# Patient Record
Sex: Female | Born: 1980 | Race: White | Hispanic: No | Marital: Married | Smoking: Never smoker
Health system: Southern US, Community
[De-identification: ages and names within clinical notes are randomized; demographics above are authoritative.]

## PROBLEM LIST (undated history)

## (undated) DIAGNOSIS — A6920 Lyme disease, unspecified: Secondary | ICD-10-CM

## (undated) DIAGNOSIS — N200 Calculus of kidney: Secondary | ICD-10-CM

## (undated) HISTORY — PX: LITHOTRIPSY: SUR834

## (undated) HISTORY — DX: Calculus of kidney: N20.0

## (undated) HISTORY — DX: Lyme disease, unspecified: A69.20

---

## 2000-08-11 ENCOUNTER — Emergency Department: Admit: 2000-08-11 | Disposition: A | Discharge status: Home / Self Care | Payer: Self-pay

## 2001-03-28 ENCOUNTER — Ambulatory Visit: Admit: 2001-03-28 | Disposition: A | Discharge status: Home / Self Care | Payer: Self-pay | Admitting: General Practice

## 2001-05-08 ENCOUNTER — Ambulatory Visit: Admit: 2001-05-08 | Disposition: A | Discharge status: Home / Self Care | Payer: Self-pay | Admitting: General Practice

## 2001-05-18 ENCOUNTER — Ambulatory Visit: Admit: 2001-05-18 | Disposition: A | Discharge status: Home / Self Care | Payer: Self-pay | Admitting: General Practice

## 2001-05-19 ENCOUNTER — Inpatient Hospital Stay: Admit: 2001-05-19 | Disposition: A | Discharge status: Home / Self Care | Payer: Self-pay | Admitting: General Practice

## 2001-10-06 ENCOUNTER — Emergency Department
Admit: 2001-10-06 | Disposition: A | Discharge status: Home / Self Care | Payer: Self-pay | Admitting: Emergency Medicine

## 2002-07-30 ENCOUNTER — Emergency Department
Admission: EM | Admit: 2002-07-30 | Disposition: A | Discharge status: Home / Self Care | Payer: Self-pay | Source: Emergency Department | Admitting: Emergency Medicine

## 2002-09-14 ENCOUNTER — Ambulatory Visit
Admission: RE | Admit: 2002-09-14 | Disposition: A | Discharge status: Home / Self Care | Payer: Self-pay | Source: Ambulatory Visit | Admitting: General Practice

## 2002-11-22 ENCOUNTER — Ambulatory Visit
Admission: AD | Admit: 2002-11-22 | Disposition: A | Discharge status: Home / Self Care | Payer: Self-pay | Source: Ambulatory Visit | Admitting: General Practice

## 2002-11-22 ENCOUNTER — Inpatient Hospital Stay
Admission: RE | Admit: 2002-11-22 | Disposition: A | Discharge status: Home / Self Care | Payer: Self-pay | Source: Ambulatory Visit | Admitting: General Practice

## 2002-12-18 ENCOUNTER — Ambulatory Visit
Admission: AD | Admit: 2002-12-18 | Disposition: A | Discharge status: Home / Self Care | Payer: Self-pay | Source: Ambulatory Visit | Admitting: General Practice

## 2003-12-15 ENCOUNTER — Emergency Department
Admission: EM | Admit: 2003-12-15 | Disposition: A | Discharge status: Home / Self Care | Payer: Self-pay | Source: Emergency Department

## 2004-10-03 ENCOUNTER — Emergency Department
Admission: EM | Admit: 2004-10-03 | Disposition: A | Discharge status: Home / Self Care | Payer: Self-pay | Source: Emergency Department | Admitting: Emergency Medicine

## 2005-02-15 ENCOUNTER — Ambulatory Visit
Admission: RE | Admit: 2005-02-15 | Disposition: A | Discharge status: Home / Self Care | Payer: Self-pay | Source: Ambulatory Visit | Admitting: Obstetrics & Gynecology

## 2005-04-02 ENCOUNTER — Observation Stay
Admission: AD | Admit: 2005-04-02 | Disposition: A | Discharge status: Home / Self Care | Payer: Self-pay | Source: Ambulatory Visit | Admitting: Obstetrics & Gynecology

## 2005-04-19 ENCOUNTER — Inpatient Hospital Stay
Admission: AD | Admit: 2005-04-19 | Disposition: A | Discharge status: Home / Self Care | Payer: Self-pay | Source: Ambulatory Visit | Admitting: Obstetrics & Gynecology

## 2006-01-20 ENCOUNTER — Emergency Department
Admission: EM | Admit: 2006-01-20 | Disposition: A | Discharge status: Home / Self Care | Payer: Self-pay | Source: Emergency Department | Admitting: Emergency Medicine

## 2006-09-09 ENCOUNTER — Emergency Department
Admission: EM | Admit: 2006-09-09 | Disposition: A | Discharge status: Home / Self Care | Payer: Self-pay | Source: Emergency Department | Admitting: Emergency Medicine

## 2006-09-12 LAB — STREP A ANTIGEN (THROAT): Streptococcus A AG: NEGATIVE

## 2006-09-12 LAB — ^INFLUENZA A & B VIRUS ANTIGEN MCKESSON: Influenza A & B Antigen: NEGATIVE

## 2008-11-12 ENCOUNTER — Emergency Department
Admission: EM | Admit: 2008-11-12 | Disposition: A | Discharge status: Home / Self Care | Payer: Self-pay | Source: Emergency Department | Admitting: Emergency Medicine

## 2008-11-13 LAB — CBC AND DIFFERENTIAL
BASOPHILS %: 0.6 % (ref 0.0–2.0)
Baso(Absolute): 0.03 10*3/uL (ref 0.00–0.20)
Eosinophils %: 1.4 % (ref 0.0–6.0)
Eosinophils Absolute: 0.07 10*3/uL — ABNORMAL LOW (ref 0.10–0.30)
Hematocrit: 41.2 % (ref 27.0–49.5)
Hemoglobin: 14.2 g/dL (ref 11.7–15.5)
Immature Granulocytes #: 0.01 10*3/uL (ref 0.00–0.05)
Immature Granulocytes %: 0.2 % — ABNORMAL HIGH (ref 0.0–0.0)
Lymphocytes Absolute: 1.35 10*3/uL (ref 1.00–4.80)
Lymphocytes Relative: 26.6 % (ref 25.0–55.0)
MCH: 31.2 pg (ref 27.0–34.0)
MCHC: 34.5 g/dL (ref 32.0–36.0)
MCV: 90.5 fL (ref 80–100)
MPV: 12 fL (ref 9.0–13.0)
Monocytes Absolute: 0.47 10*3/uL (ref 0.10–1.20)
Monocytes Relative %: 9.3 % — ABNORMAL HIGH (ref 1.0–8.0)
Neutrophils Absolute: 3.16 10*3/uL (ref 1.80–7.70)
Neutrophils Relative %: 62.1 % (ref 49.0–69.0)
Nucleated RBC %: 0 /100WBC (ref 0.0–0.0)
Nucleted RBC #: 0 10*3/uL (ref 0.00–0.00)
Platelets: 193 10*3/uL (ref 150–400)
RBC: 4.55 M/uL (ref 3.80–5.40)
RDW: 12.5 % (ref 11.0–14.0)
WBC: 5.08 10*3/uL (ref 4.80–10.80)

## 2008-11-13 LAB — B-TYPE NATRIURETIC PEPTIDE: B-Natriuretic Peptide: 8 pg/mL (ref 0–100)

## 2008-11-13 LAB — COMPREHENSIVE METABOLIC PANEL
ALT: 62 U/L — ABNORMAL HIGH (ref 7–56)
AST (SGOT): 40 U/L (ref 5–40)
Albumin, Synovial: 4.7 g/dL (ref 3.9–5.0)
Alkaline Phosphatase: 91 U/L (ref 38–126)
BUN / Creatinine Ratio: 15 (ref 8–20)
BUN: 12 mg/dL (ref 6–20)
Bilirubin, Total: 0.4 mg/dL (ref 0.2–1.3)
CO2: 27 mmol/L (ref 21.0–31.0)
Calcium: 9.4 mg/dL (ref 8.4–10.2)
Chloride: 105 mmol/L (ref 101–111)
Creatinine: 0.8 mg/dL (ref 0.52–1.04)
EGFR: >60 mL/min/{1.73_m2}
EGFR: >60 mL/min/{1.73_m2}
Glucose: 76 mg/dL (ref 70–100)
Potassium: 4.2 mmol/L (ref 3.6–5.0)
Protein, Total: 7.4 g/dL (ref 6.3–8.2)
Sodium: 139 mmol/L (ref 135–145)

## 2008-11-13 LAB — MAGNESIUM: Magnesium: 1.9 mg/dL (ref 1.7–2.2)

## 2008-11-13 LAB — PTT (PARTIAL THROMBOPLASTIN TIME)
PTT Ratio: 1 (ref 0.8–1.2)
PTT: 27.6 s (ref 21.6–34.0)

## 2008-11-13 LAB — D-DIMER, QUANTITATIVE: D-Dimer, Quant.: 324 ng/mL

## 2008-11-13 LAB — CK: Creatine Kinase (CK): 40 U/L (ref 19–204)

## 2008-11-13 LAB — TROPONIN I: Troponin I: <0.01 ng/mL (ref 0.000–0.034)

## 2008-11-13 LAB — PT (PROTHROMBIN TIME)
PT INR: 1
PT: 11.7 s (ref 10.6–12.8)

## 2008-11-13 LAB — HCG, SERUM, QUALITATIVE: Hcg Qualitative: NEGATIVE

## 2008-11-13 LAB — MYOGLOBIN, SERUM: Myoglobin: 21 ng/mL (ref 0–62)

## 2008-12-15 ENCOUNTER — Emergency Department
Admission: EM | Admit: 2008-12-15 | Disposition: A | Discharge status: Home / Self Care | Payer: Self-pay | Source: Emergency Department | Admitting: Emergency Medicine

## 2008-12-17 LAB — COMPREHENSIVE METABOLIC PANEL
ALT: 60 U/L — ABNORMAL HIGH (ref 7–56)
AST (SGOT): 39 U/L (ref 5–40)
Albumin, Synovial: 4.6 g/dL (ref 3.9–5.0)
Alkaline Phosphatase: 95 U/L (ref 38–126)
BUN / Creatinine Ratio: 21 — ABNORMAL HIGH (ref 8–20)
BUN: 17 mg/dL (ref 6–20)
Bilirubin, Total: 0.3 mg/dL (ref 0.2–1.3)
CO2: 27 mmol/L (ref 21.0–31.0)
Calcium: 9 mg/dL (ref 8.4–10.2)
Chloride: 102 mmol/L (ref 101–111)
Creatinine: 0.82 mg/dL (ref 0.52–1.04)
EGFR: >60 mL/min/{1.73_m2}
EGFR: >60 mL/min/{1.73_m2}
Glucose: 85 mg/dL (ref 70–100)
Potassium: 3.6 mmol/L (ref 3.6–5.0)
Protein, Total: 7 g/dL (ref 6.3–8.2)
Sodium: 140 mmol/L (ref 135–145)

## 2008-12-17 LAB — CBC AND DIFFERENTIAL
BASOPHILS %: 0.7 % (ref 0.0–2.0)
Baso(Absolute): 0.05 10*3/uL (ref 0.00–0.20)
Eosinophils %: 1 % (ref 0.0–6.0)
Eosinophils Absolute: 0.07 10*3/uL — ABNORMAL LOW (ref 0.10–0.30)
Hematocrit: 40.2 % (ref 27.0–49.5)
Hemoglobin: 14.1 g/dL (ref 11.7–15.5)
Immature Granulocytes #: 0.02 10*3/uL (ref 0.00–0.05)
Immature Granulocytes %: 0.3 % — ABNORMAL HIGH (ref 0.0–0.0)
Lymphocytes Absolute: 2.35 10*3/uL (ref 1.00–4.80)
Lymphocytes Relative: 32.5 % (ref 25.0–55.0)
MCH: 31.3 pg (ref 27.0–34.0)
MCHC: 35.1 g/dL (ref 32.0–36.0)
MCV: 89.3 fL (ref 80–100)
MPV: 11.5 fL (ref 9.0–13.0)
Monocytes Absolute: 0.56 10*3/uL (ref 0.10–1.20)
Monocytes Relative %: 7.8 % (ref 1.0–8.0)
Neutrophils Absolute: 4.19 10*3/uL (ref 1.80–7.70)
Neutrophils Relative %: 58 % (ref 49.0–69.0)
Nucleated RBC %: 0 /100WBC (ref 0.0–0.0)
Nucleted RBC #: 0 10*3/uL (ref 0.00–0.00)
Platelets: 200 10*3/uL (ref 150–400)
RBC: 4.5 M/uL (ref 3.80–5.40)
RDW: 12.4 % (ref 11.0–14.0)
WBC: 7.22 10*3/uL (ref 4.80–10.80)

## 2008-12-17 LAB — HCG, SERUM, QUALITATIVE: Hcg Qualitative: NEGATIVE

## 2008-12-17 LAB — D-DIMER, QUANTITATIVE: D-Dimer, Quant.: 263 ng/mL

## 2008-12-17 LAB — TROPONIN I: Troponin I: <0.01 ng/mL (ref 0.000–0.034)

## 2008-12-17 LAB — SEDIMENTATION RATE, AUTOMATED: Sed Rate: 20 (ref 0–20)

## 2008-12-17 LAB — CK: Creatine Kinase (CK): 36 U/L (ref 19–204)

## 2009-08-03 ENCOUNTER — Emergency Department
Admission: EM | Admit: 2009-08-03 | Disposition: A | Discharge status: Home / Self Care | Payer: Self-pay | Source: Emergency Department | Admitting: Emergency Medicine

## 2009-08-05 LAB — COMPREHENSIVE METABOLIC PANEL
ALT: 40 U/L (ref 7–56)
AST (SGOT): 38 U/L (ref 5–40)
Albumin, Synovial: 4.6 g/dL (ref 3.9–5.0)
Alkaline Phosphatase: 78 U/L (ref 38–126)
BUN / Creatinine Ratio: 22 — ABNORMAL HIGH (ref 8–20)
BUN: 18 mg/dL (ref 6–20)
Bilirubin, Total: 0.3 mg/dL (ref 0.2–1.3)
CO2: 26 mmol/L (ref 21.0–31.0)
Calcium: 9.7 mg/dL (ref 8.4–10.2)
Chloride: 103 mmol/L (ref 101–111)
Creatinine: 0.8 mg/dL (ref 0.52–1.04)
EGFR: >60 mL/min/{1.73_m2}
EGFR: >60 mL/min/{1.73_m2}
Glucose: 87 mg/dL (ref 70–100)
Potassium: 4 mmol/L (ref 3.6–5.0)
Protein, Total: 6.8 g/dL (ref 6.3–8.2)
Sodium: 137 mmol/L (ref 135–145)

## 2009-08-05 LAB — CBC AND DIFFERENTIAL
BASOPHILS %: 0.5 % (ref 0.0–2.0)
Baso(Absolute): 0.04 10*3/uL (ref 0.00–0.20)
Eosinophils %: 5.6 % (ref 0.0–6.0)
Eosinophils Absolute: 0.42 10*3/uL — ABNORMAL HIGH (ref 0.10–0.30)
Hematocrit: 37.7 % (ref 27.0–49.5)
Hemoglobin: 13.1 g/dL (ref 11.7–15.5)
Immature Granulocytes #: 0.01 10*3/uL (ref 0.00–0.05)
Immature Granulocytes %: 0.1 % — ABNORMAL HIGH (ref 0.0–0.0)
Lymphocytes Absolute: 1.87 10*3/uL (ref 1.00–4.80)
Lymphocytes Relative: 25 % (ref 25.0–55.0)
MCH: 31.4 pg (ref 27.0–34.0)
MCHC: 34.7 g/dL (ref 32.0–36.0)
MCV: 90.4 fL (ref 80–100)
MPV: 11.5 fL (ref 9.0–13.0)
Monocytes Absolute: 0.52 10*3/uL (ref 0.10–1.20)
Monocytes Relative %: 7 % (ref 1.0–8.0)
Neutrophils Absolute: 4.62 10*3/uL (ref 1.80–7.70)
Neutrophils Relative %: 61.9 % (ref 49.0–69.0)
Nucleated RBC %: 0 /100WBC (ref 0.0–0.0)
Nucleted RBC #: 0 10*3/uL (ref 0.00–0.00)
Platelets: 193 10*3/uL (ref 150–400)
RBC: 4.17 M/uL (ref 3.80–5.40)
RDW: 12.4 % (ref 11.0–14.0)
WBC: 7.47 10*3/uL (ref 4.80–10.80)

## 2009-08-05 LAB — URINALYSIS
Bilirubin, UA: NEGATIVE
Blood, UA: NEGATIVE
Glucose, UA: NEGATIVE
Ketones UA: NEGATIVE
Leukocyte Esterase, UA: NEGATIVE
Nitrate: NEGATIVE
Protein, UR: NEGATIVE
Specific Gravity, UR: 1.01 (ref 1.000–1.035)
Urobilinogen, UA: NORMAL
pH, Urine: 7 (ref 5.0–8.0)

## 2009-08-05 LAB — LIPASE: Lipase: 94 U/L (ref 23–300)

## 2009-08-05 LAB — HCG, QUALITATIVE URINE: Urine HCG Qualitative: NEGATIVE

## 2009-08-07 ENCOUNTER — Emergency Department
Admission: EM | Admit: 2009-08-07 | Disposition: A | Discharge status: Home / Self Care | Payer: Self-pay | Source: Emergency Department | Admitting: Emergency Medicine

## 2009-08-08 LAB — CBC AND DIFFERENTIAL
BASOPHILS %: 0.5 % (ref 0.0–2.0)
Baso(Absolute): 0.03 10*3/uL (ref 0.00–0.20)
Eosinophils %: 5.4 % (ref 0.0–6.0)
Eosinophils Absolute: 0.3 10*3/uL (ref 0.10–0.30)
Hematocrit: 39.2 % (ref 27.0–49.5)
Hemoglobin: 13.7 g/dL (ref 11.7–15.5)
Immature Granulocytes #: 0.01 10*3/uL (ref 0.00–0.05)
Immature Granulocytes %: 0.2 % — ABNORMAL HIGH (ref 0.0–0.0)
Lymphocytes Absolute: 1.13 10*3/uL (ref 1.00–4.80)
Lymphocytes Relative: 20.2 % — ABNORMAL LOW (ref 25.0–55.0)
MCH: 31.4 pg (ref 27.0–34.0)
MCHC: 34.9 g/dL (ref 32.0–36.0)
MCV: 89.9 fL (ref 80–100)
MPV: 11.7 fL (ref 9.0–13.0)
Monocytes Absolute: 0.42 10*3/uL (ref 0.10–1.20)
Monocytes Relative %: 7.5 % (ref 1.0–8.0)
Neutrophils Absolute: 3.71 10*3/uL (ref 1.80–7.70)
Neutrophils Relative %: 66.4 % (ref 49.0–69.0)
Nucleated RBC %: 0 /100WBC (ref 0.0–0.0)
Nucleted RBC #: 0 10*3/uL (ref 0.00–0.00)
Platelets: 184 10*3/uL (ref 150–400)
RBC: 4.36 M/uL (ref 3.80–5.40)
RDW: 12.2 % (ref 11.0–14.0)
WBC: 5.59 10*3/uL (ref 4.80–10.80)

## 2009-08-08 LAB — COMPREHENSIVE METABOLIC PANEL
ALT: 48 U/L (ref 7–56)
AST (SGOT): 33 U/L (ref 5–40)
Albumin, Synovial: 4.4 g/dL (ref 3.9–5.0)
Alkaline Phosphatase: 75 U/L (ref 38–126)
BUN / Creatinine Ratio: 14 (ref 8–20)
BUN: 12 mg/dL (ref 6–20)
Bilirubin, Total: 0.3 mg/dL (ref 0.2–1.3)
CO2: 27 mmol/L (ref 21.0–31.0)
Calcium: 9.8 mg/dL (ref 8.4–10.2)
Chloride: 103 mmol/L (ref 101–111)
Creatinine: 0.81 mg/dL (ref 0.52–1.04)
EGFR: >60 mL/min/{1.73_m2}
EGFR: >60 mL/min/{1.73_m2}
Glucose: 102 mg/dL — ABNORMAL HIGH (ref 70–100)
Potassium: 4.2 mmol/L (ref 3.6–5.0)
Protein, Total: 6.6 g/dL (ref 6.3–8.2)
Sodium: 141 mmol/L (ref 135–145)

## 2009-08-08 LAB — LIPASE: Lipase: 53 U/L (ref 23–300)

## 2010-02-25 ENCOUNTER — Emergency Department
Admission: EM | Admit: 2010-02-25 | Disposition: A | Discharge status: Home / Self Care | Payer: Self-pay | Source: Emergency Department | Admitting: Emergency Medicine

## 2010-02-26 LAB — URINALYSIS
Bilirubin, UA: NEGATIVE
Blood, UA: NEGATIVE
Glucose, UA: NEGATIVE
Leukocyte Esterase, UA: NEGATIVE
Nitrate: NEGATIVE
Protein, UR: NEGATIVE
Specific Gravity, UR: 1.019 (ref 1.000–1.035)
Urobilinogen, UA: NORMAL
pH, Urine: 6 (ref 5.0–8.0)

## 2010-02-26 LAB — HCG, QUALITATIVE URINE: Urine HCG Qualitative: NEGATIVE

## 2010-05-12 ENCOUNTER — Emergency Department
Admission: EM | Admit: 2010-05-12 | Disposition: A | Discharge status: Home / Self Care | Payer: Self-pay | Source: Emergency Department | Admitting: Emergency Medicine

## 2010-05-28 ENCOUNTER — Emergency Department
Admission: EM | Admit: 2010-05-28 | Disposition: A | Discharge status: Home / Self Care | Payer: Self-pay | Source: Emergency Department | Admitting: Emergency Medicine

## 2010-10-31 ENCOUNTER — Emergency Department
Admission: EM | Admit: 2010-10-31 | Disposition: A | Discharge status: Home / Self Care | Payer: Self-pay | Source: Emergency Department | Admitting: Emergency Medicine

## 2012-04-30 LAB — ECG 12-LEAD
Atrial Rate: 79 {beats}/min
Atrial Rate: 82 {beats}/min
P Axis: 22 degrees
P Axis: 52 degrees
P-R Interval: 122 ms
P-R Interval: 124 ms
Q-T Interval: 374 ms
Q-T Interval: 378 ms
QRS Duration: 80 ms
QRS Duration: 80 ms
QTC Calculation (Bezet): 428 ms
QTC Calculation (Bezet): 441 ms
R Axis: 87 degrees
R Axis: 84 degrees
T Axis: 53 degrees
T Axis: 60 degrees
Ventricular Rate: 79 {beats}/min
Ventricular Rate: 82 {beats}/min

## 2013-11-21 ENCOUNTER — Emergency Department
Admission: EM | Admit: 2013-11-21 | Discharge: 2013-11-21 | Disposition: A | Discharge status: Home / Self Care | Payer: Medicaid Other | Attending: Emergency Medicine | Admitting: Emergency Medicine

## 2013-11-21 ENCOUNTER — Emergency Department: Payer: Medicaid Other

## 2013-11-21 DIAGNOSIS — Z87442 Personal history of urinary calculi: Secondary | ICD-10-CM | POA: Insufficient documentation

## 2013-11-21 DIAGNOSIS — R109 Unspecified abdominal pain: Secondary | ICD-10-CM

## 2013-11-21 DIAGNOSIS — N12 Tubulo-interstitial nephritis, not specified as acute or chronic: Secondary | ICD-10-CM | POA: Insufficient documentation

## 2013-11-21 LAB — COMPREHENSIVE METABOLIC PANEL
ALT: 25 U/L (ref 0–55)
AST (SGOT): 21 U/L (ref 5–34)
Albumin/Globulin Ratio: 1.7 (ref 0.9–2.2)
Albumin: 3.8 g/dL (ref 3.5–5.0)
Alkaline Phosphatase: 64 U/L (ref 40–150)
Anion Gap: 9 (ref 5.0–15.0)
BUN: 12 mg/dL (ref 7.0–19.0)
Bilirubin, Total: 0.2 mg/dL (ref 0.2–1.2)
CO2: 24 mEq/L (ref 22–29)
Calcium: 9.2 mg/dL (ref 8.5–10.5)
Chloride: 108 mEq/L — ABNORMAL HIGH (ref 98–107)
Creatinine: 0.8 mg/dL (ref 0.6–1.0)
Globulin: 2.3 g/dL (ref 2.0–3.6)
Glucose: 102 mg/dL — ABNORMAL HIGH (ref 70–100)
Potassium: 3.7 mEq/L (ref 3.5–5.1)
Protein, Total: 6.1 g/dL (ref 6.0–8.3)
Sodium: 141 mEq/L (ref 136–145)

## 2013-11-21 LAB — URINALYSIS
Bilirubin, UA: NEGATIVE
Blood, UA: NEGATIVE
Glucose, UA: NEGATIVE
Ketones UA: NEGATIVE
Nitrite, UA: NEGATIVE
Protein, UR: NEGATIVE
Specific Gravity UA: 1.027 (ref 1.001–1.035)
Urine pH: 6 (ref 5.0–8.0)
Urobilinogen, UA: 0.2 mg/dL (ref 0.2–2.0)

## 2013-11-21 LAB — CBC AND DIFFERENTIAL
Basophils Absolute Automated: 0.04 10*3/uL (ref 0.00–0.20)
Basophils Automated: 0 %
Eosinophils Absolute Automated: 0.09 10*3/uL (ref 0.00–0.70)
Eosinophils Automated: 1 %
Hematocrit: 35.8 % — ABNORMAL LOW (ref 37.0–47.0)
Hgb: 11.9 g/dL — ABNORMAL LOW (ref 12.0–16.0)
Lymphocytes Absolute Automated: 2.44 10*3/uL (ref 0.50–4.40)
Lymphocytes Automated: 33 %
MCH: 31.1 pg (ref 28.0–32.0)
MCHC: 33.2 g/dL (ref 32.0–36.0)
MCV: 93.5 fL (ref 80.0–100.0)
MPV: 11.1 fL (ref 9.4–12.3)
Monocytes Absolute Automated: 0.72 10*3/uL (ref 0.00–1.20)
Monocytes: 10 %
Neutrophils Absolute: 4.17 10*3/uL (ref 1.80–8.10)
Neutrophils: 56 %
Platelets: 214 10*3/uL (ref 140–400)
RBC: 3.83 10*6/uL — ABNORMAL LOW (ref 4.20–5.40)
RDW: 12 % (ref 12–15)
WBC: 7.46 10*3/uL (ref 3.50–10.80)

## 2013-11-21 LAB — URINE HCG QUALITATIVE: Urine HCG Qualitative: NEGATIVE

## 2013-11-21 LAB — URINE MICROSCOPIC

## 2013-11-21 LAB — GFR: EGFR: >60

## 2013-11-21 MED ORDER — HYDROMORPHONE HCL PF 1 MG/ML IJ SOLN
0.5000 mg | Freq: Once | INTRAMUSCULAR | Status: AC
Start: 2013-11-21 — End: 2013-11-21
  Administered 2013-11-21: 0.5 mg via INTRAVENOUS
  Filled 2013-11-21: qty 1

## 2013-11-21 MED ORDER — OXYCODONE-ACETAMINOPHEN 5-325 MG PO TABS
ORAL_TABLET | ORAL | Status: AC
Start: 2013-11-21 — End: 2013-12-01

## 2013-11-21 MED ORDER — OXYCODONE-ACETAMINOPHEN 5-325 MG PO TABS
1.0000 | ORAL_TABLET | Freq: Once | ORAL | Status: DC
Start: 2013-11-21 — End: 2013-11-21

## 2013-11-21 MED ORDER — OXYCODONE-ACETAMINOPHEN 5-325 MG PO TABS
2.0000 | ORAL_TABLET | Freq: Once | ORAL | Status: AC
Start: 2013-11-21 — End: 2013-11-21
  Administered 2013-11-21: 2 via ORAL
  Filled 2013-11-21: qty 2

## 2013-11-21 MED ORDER — ONDANSETRON HCL 4 MG/2ML IJ SOLN
4.0000 mg | Freq: Once | INTRAMUSCULAR | Status: AC
Start: 2013-11-21 — End: 2013-11-21
  Administered 2013-11-21: 4 mg via INTRAVENOUS
  Filled 2013-11-21: qty 2

## 2013-11-21 MED ORDER — KETOROLAC TROMETHAMINE 30 MG/ML IJ SOLN
30.0000 mg | Freq: Once | INTRAMUSCULAR | Status: AC
Start: 2013-11-21 — End: 2013-11-21
  Administered 2013-11-21: 30 mg via INTRAVENOUS
  Filled 2013-11-21: qty 1

## 2013-11-21 MED ORDER — CIPROFLOXACIN HCL 500 MG PO TABS
500.0000 mg | ORAL_TABLET | Freq: Once | ORAL | Status: AC
Start: 2013-11-21 — End: 2013-11-21
  Administered 2013-11-21: 500 mg via ORAL
  Filled 2013-11-21: qty 1

## 2013-11-21 MED ORDER — CIPROFLOXACIN HCL 500 MG PO TABS
500.0000 mg | ORAL_TABLET | Freq: Two times a day (BID) | ORAL | Status: AC
Start: 2013-11-21 — End: 2013-11-28

## 2013-11-21 NOTE — ED Provider Notes (Signed)
Physician/Midlevel provider first contact with patient: 11/21/13 0042         History     Chief Complaint   Patient presents with   . Flank Pain     Patient is a 33 y.o. female presenting with flank pain. The history is provided by the patient and the spouse.   Flank Pain  This is a recurrent problem. The current episode started more than 1 month ago. The problem occurs intermittently. The problem has been gradually worsening. Associated symptoms include urinary symptoms. Pertinent negatives include no abdominal pain, change in bowel habit, chest pain, chills, congestion, coughing, fever, headaches, nausea, neck pain, numbness, vomiting or weakness. Nothing aggravates the symptoms. Treatments tried: essential oils  The treatment provided mild relief.   33 year female with right sided flank pain off and on for 2 months, has pain every few days and lasts short time. Worse few days ago right flank sharp stabby pain 6/10, nothing makes worse or better. Took essential oils for pain. Nausea but no vomiting. Blood noted in urine today and felt was urine related. Stool changes noted thinner, LBM today. Feels like this may be another kidney stone    PCP Edwyna Shell   Past Medical History   Diagnosis Date   . Calculus of kidney    . Lyme disease    LMP 2 weeks ago denies pregnancy     Past Surgical History   Procedure Date   . Lithotripsy        No family history on file.    Social  History   Substance Use Topics   . Smoking status: Never Smoker    . Smokeless tobacco: Not on file   . Alcohol Use: No   .     No Known Allergies    Current/Home Medications    No medications on file        Review of Systems   Constitutional: Negative for fever and chills.   HENT: Negative for congestion and rhinorrhea.    Respiratory: Negative for cough and shortness of breath.    Cardiovascular: Negative for chest pain.   Gastrointestinal: Negative for nausea, vomiting, abdominal pain and change in bowel habit.   Genitourinary: Positive for hematuria  and flank pain. Negative for dysuria, vaginal bleeding and vaginal discharge.   Musculoskeletal: Positive for back pain. Negative for gait problem and neck pain.   Neurological: Negative for weakness, numbness and headaches.   All other systems reviewed and are negative.        Physical Exam    BP: 123/74 mmHg, Heart Rate: 99 , Temp: 96.6 F (35.9 C), Resp Rate: 20 , SpO2: 100 %, Weight: 72.576 kg    Physical Exam   Nursing note and vitals reviewed.  Constitutional: She is oriented to person, place, and time. She appears well-developed and well-nourished. No distress.   HENT:   Head: Normocephalic and atraumatic.   Nose: Nose normal.   Mouth/Throat: Oropharynx is clear and moist. No oropharyngeal exudate.   Eyes: Conjunctivae normal and EOM are normal. Pupils are equal, round, and reactive to light.   Neck: Normal range of motion. Neck supple.   Cardiovascular: Normal rate, normal heart sounds and intact distal pulses.    No murmur heard.  Pulmonary/Chest: Effort normal and breath sounds normal. No respiratory distress. She has no wheezes. She has no rales.   Abdominal: Soft. Bowel sounds are normal. She exhibits no distension. There is no tenderness. There is no CVA tenderness.  Musculoskeletal: Normal range of motion. She exhibits no edema and no tenderness.        Lumbar back: Normal.   Lymphadenopathy:     She has no cervical adenopathy.   Neurological: She is alert and oriented to person, place, and time. No cranial nerve deficit or sensory deficit. She exhibits normal muscle tone. Coordination and gait normal.   Skin: Skin is warm and dry. No rash noted. She is not diaphoretic. No erythema. No pallor.   Psychiatric: She has a normal mood and affect. Her behavior is normal.       MDM and ED Course     ED Medication Orders      Start     Status Ordering Provider    11/21/13 0247      Once,   Status:  Discontinued      Route: Oral  Ordered Dose: 1 tablet         Discontinued Marlane Hatcher J    11/21/13 0220    ciprofloxacin (CIPRO) tablet 500 mg   Once in ED      Route: Oral  Ordered Dose: 500 mg         Last MAR action:  Given Reginia Forts    11/21/13 0220   oxyCODONE-acetaminophen (PERCOCET) 5-325 MG per tablet 2 tablet   Once      Route: Oral  Ordered Dose: 2 tablet         Last MAR action:  Given Reginia Forts    11/21/13 1610   ketorolac (TORADOL) injection 30 mg   Once      Route: Intravenous  Ordered Dose: 30 mg         Last MAR action:  Given Reginia Forts    11/21/13 0104   HYDROmorphone (DILAUDID) injection 0.5 mg   Once      Route: Intravenous  Ordered Dose: 0.5 mg         Last MAR action:  Given Neave Lenger J    11/21/13 0104   ondansetron (ZOFRAN) injection 4 mg   Once      Route: Intravenous  Ordered Dose: 4 mg         Last MAR action:  Given Syla Devoss J                 MDM  Number of Diagnoses or Management Options  Pyelonephritis:   Right flank pain:   Diagnosis management comments: Dr. Marlane Hatcher  is the primary attending for this patient and has obtained and performed the history, PE, and medical decision making for this patient.    Oxygen saturation by pulse oximetry is 95%-100%, Normal.  Interventions: None Needed and Patient Observed.     Results     Procedure Component Value Units Date/Time    Comprehensive Metabolic Panel (CMP) ([id number])  (Abnormal)   Collected:11/21/13 0128    Specimen Information:Blood Updated:11/21/13 0154     Glucose 102 (H) mg/dL      BUN 81.1 mg/dL      Creatinine 0.8 mg/dL      Sodium 914 mEq/L      Potassium 3.7 mEq/L      Chloride 108 (H) mEq/L      CO2 24 mEq/L      CALCIUM 9.2 mg/dL      Protein, Total 6.1 g/dL      Albumin 3.8 g/dL      AST (SGOT) 21 U/L      ALT 25  U/L      Alkaline Phosphatase 64 U/L      Bilirubin, Total 0.2 mg/dL      Globulin 2.3 g/dL      Albumin/Globulin Ratio 1.7      Anion Gap 9.0     GFR ([id number]) Collected:11/21/13 0128     EGFR >60.0 Updated:11/21/13 0154    CBC with Differential ([id number])  (Abnormal) Collected:11/21/13  0128    Specimen Information:Blood / Blood Updated:11/21/13 0133     WBC 7.46 x10 3/uL      RBC 3.83 (L) x10 6/uL      Hgb 11.9 (L) g/dL      Hematocrit 47.8 (L) %      MCV 93.5 fL      MCH 31.1 pg      MCHC 33.2 g/dL      RDW 12 %      Platelets 214 x10 3/uL      MPV 11.1 fL      Neutrophils 56 %      Lymphocytes Automated 33 %      Monocytes 10 %      Eosinophils Automated 1 %      Basophils Automated 0 %      Immature Granulocyte Unmeasured %      Nucleated RBC Unmeasured /100 WBC      Neutrophils Absolute 4.17 x10 3/uL      Abs Lymph Automated 2.44 x10 3/uL      Abs Mono Automated 0.72 x10 3/uL      Abs Eos Automated 0.09 x10 3/uL      Absolute Baso Automated 0.04 x10 3/uL      Absolute Immature Granulocyte Unmeasured x10 3/uL     Urinalysis ([id number])  (Abnormal) Collected:11/21/13 0049     Urine Type Clean Catch Updated:11/21/13 0113     Color, UA YELLOW      Clarity, UA SL CLOUDY (A)      Specific Gravity UA 1.027      Urine pH 6.0      Leukocyte Esterase, UA TRACE (A)      Nitrite, UA NEGATIVE      Protein, UR NEGATIVE      Glucose, UA NEGATIVE      Ketones UA NEGATIVE      Urobilinogen, UA 0.2 mg/dL      Bilirubin, UA NEGATIVE      Blood, UA NEGATIVE     Microscopic, Urine ([id number])  (Abnormal) Collected:11/21/13 0049     RBC, UA 0 - 5 /hpf Updated:11/21/13 0113     WBC, UA 6 - 10 (A) /hpf      Squamous Epithelial Cells, Urine 0 - 5 /hpf      Urine Bacteria Few (A) /hpf     HCG, Qualitative, Urine ([id number]) Collected:11/21/13 0049     Urine HCG Qualitative Negative Updated:11/21/13 0103        Radiology Results (24 Hour)     Procedure Component Value Units Date/Time    CT Abd/ Pelvis without Contrast ([id number]) Collected:11/21/13 0130    Order Status:Completed  Updated:11/21/13 5366    Narrative:    Reason for exam: 33 year old female with right flank pain. Evaluate for  kidney stones.    TECHNIQUE: Spiral CT without contrast.    FINDINGS:  There are no detectable renal calculi. The kidneys are normal  in size  and shape. There is no hydronephrosis. No stones can be seen within  either ureter or within the bladder lumen. There is a  right pelvic  phlebolith.    Elsewhere, limited noncontrast evaluation of the liver, spleen,  pancreas, and adrenal glands is unremarkable. Gallbladder is contracted.  There is no bowel thickening, distention, or perceived inflammation.  There is no obstruction or free air. The appendix is normal. There is no  lymphadenopathy or ascites. Pelvic organs are intact.    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 Lung bases are clear.     Impression:     1. No detectable renal calculi.  2. No detectable acute abnormalities.    Wilmon Pali, MD   11/21/2013 1:32 AM    Discussed with patient about ct and labs, no renal calc on ct or blood noted in urine, pain better with meds  Discussed with her about plan of care.        Amount and/or Complexity of Data Reviewed  Clinical lab tests: ordered and reviewed  Tests in the radiology section of CPT: ordered and reviewed    Patient Progress  Patient progress: improved        Procedures    Clinical Impression & Disposition     Clinical Impression  Final diagnoses:   Right flank pain   Pyelonephritis        ED Disposition     Discharge Anakaren Campion discharge to home/self care.    Condition at disposition: Stable             New Prescriptions    CIPROFLOXACIN (CIPRO) 500 MG TABLET    Take 1 tablet (500 mg total) by mouth 2 (two) times daily.    OXYCODONE-ACETAMINOPHEN (PERCOCET) 5-325 MG PER TABLET    1-2 tablets by mouth every 4-6 hours as needed for pain;  Do not drive or operate machinery while taking this medicine                 Sharada Albornoz, Lindalou Hose, MD  11/21/13 [id number]

## 2013-11-21 NOTE — ED Notes (Signed)
pt w/ hx of kidney stones, c/o pain to right flank that radiates around the front, sharp stabbing, pt sts the sharp pains have been coming and going for a couple of months tonight increases in severity and time. Pt sts  Having some blood in her urine as well.

## 2013-11-21 NOTE — Discharge Instructions (Signed)
Pyelonephritis     You have been diagnosed with pyelonephritis. This is also known as a kidney infection.     Pyelonephritis is a bacterial infection in the kidneys. Some symptoms are: Fever (temperature higher than 100.4ºF / 38ºC), chills, nausea (sick to the stomach) and vomiting (throwing up). Back pain on one or both sides is another symptom. There is sometimes trouble urinating (peeing). You may also feel very weak. The diagnosis is made based on your symptoms and a test of your urine.     Pyelonephritis most happens most often when a lower urinary tract infection (bladder infection) climbs up the urinary tract and gets into the kidney. It is important to treat a bladder infection early. This is to prevent pyelonephritis and possible kidney damage.     Pyelonephritis is treated with antibiotics, pain and fever medicines and fluids. Some patients need an "IV" if they have nausea (sick to the stomach) or vomiting and cannot keep down the antibiotics, or if they aren’t getting better after 2-3 days of oral (by mouth) medications. Most pyelonephritis patients do not need to check into the hospital. A few patients who don’t do well with the oral (by mouth) antibiotics or get sicker despite treatment may need to get admitted to the hospital.     YOU SHOULD SEEK MEDICAL ATTENTION IMMEDIATELY, EITHER HERE OR AT THE NEAREST EMERGENCY DEPARTMENT, IF ANY OF THE FOLLOWING OCCURS:  · Failure to improve after 2-3 days of antibiotics.  · Nausea or vomiting develops and you cannot to keep down medicines or fluids.  · Increased weakness or lightheadedness.  · Any progressive or worsening symptoms or any other concerns develop.

## 2013-11-25 ENCOUNTER — Other Ambulatory Visit (INDEPENDENT_AMBULATORY_CARE_PROVIDER_SITE_OTHER): Payer: Self-pay | Admitting: Emergency Medicine

## 2014-06-16 ENCOUNTER — Emergency Department: Payer: Self-pay

## 2014-06-16 ENCOUNTER — Emergency Department
Admission: EM | Admit: 2014-06-16 | Discharge: 2014-06-16 | Disposition: A | Discharge status: Home / Self Care | Payer: Self-pay | Attending: Emergency Medicine | Admitting: Emergency Medicine

## 2014-06-16 DIAGNOSIS — S93401A Sprain of unspecified ligament of right ankle, initial encounter: Secondary | ICD-10-CM | POA: Insufficient documentation

## 2014-06-16 DIAGNOSIS — Y9301 Activity, walking, marching and hiking: Secondary | ICD-10-CM | POA: Insufficient documentation

## 2014-06-16 DIAGNOSIS — X58XXXA Exposure to other specified factors, initial encounter: Secondary | ICD-10-CM | POA: Insufficient documentation

## 2014-06-16 MED ORDER — OXYCODONE-ACETAMINOPHEN 5-325 MG PO TABS
ORAL_TABLET | ORAL | Status: DC
Start: 2014-06-16 — End: 2014-07-23

## 2014-06-16 MED ORDER — IBUPROFEN 800 MG PO TABS
800.0000 mg | ORAL_TABLET | Freq: Three times a day (TID) | ORAL | Status: DC | PRN
Start: 2014-06-16 — End: 2014-07-23

## 2014-06-16 MED ORDER — OXYCODONE-ACETAMINOPHEN 5-325 MG PO TABS
1.0000 | ORAL_TABLET | Freq: Once | ORAL | Status: AC
Start: 2014-06-16 — End: 2014-06-16
  Administered 2014-06-16: 1 via ORAL
  Filled 2014-06-16: qty 1

## 2014-06-16 NOTE — Discharge Instructions (Signed)
Ankle Sprain     You have been diagnosed with an ankle sprain.     A sprain is a ligament injury, usually a tear or partial tear. Sprains can hurt as much as broken bones. Sprains can be classified by the degree of injury. A first-degree sprain is considered a minor tear. A second-degree sprain is a partial tear of the ligament. A third-degree sprain often involves a small fracture, or break, of the bone that the ligament is attached to.     Sprains are usually treated with pain medication and a splint to keep the joint from moving. You should Rest, Ice, Compress, and Elevate the injured ankle. Remember this as "RICE."  · REST: Limit the use of the injured body part.  · ICE: By applying ice to the affected area, swelling and pain can be reduced. Place some ice cubes in a re-sealable (Ziploc®) bag and add some water. Put a thin washcloth between the bag and the skin. Apply the ice bag to the area for at least 20 minutes. Do this at least 4 times per day. Using the ice for longer times and more frequently is OK. NEVER APPLY ICE DIRECTLY TO THE SKIN.  · COMPRESS: Compression means to apply pressure around the injured area such as with a splint, cast or an ACE® bandage. Compression decreases swelling and improves comfort. Compression should be tight enough to relieve swelling but not so tight as to decrease circulation. Increasing pain, numbness, tingling, or change in skin color, are all signs of decreased circulation.  · ELEVATE: Elevate the injured part. For example, a sprained ankle can be placed up on a chair while sitting and propped up on pillows while lying down.     You have been given an ACE® BANDAGE. The bandage will compress the ankle. This increases comfort and reduces swelling. The ACE® bandage should fit snugly but not so tight as to decrease circulation (blood supply). Watch for swelling of the area outside the ACE® wrap. Check capillary refill (circulation) in your toenails. To do this, press on the  nail. It should turn white. When you let go, the nail should return to pink in less than 2 seconds. If it doesn’t, the bandage is too tight. Loosen the wrap if you need to.     Wear the ACE® bandage:  · Under your air/gel splint as long as you are using the splint.     You have been given an AIR/GEL SPLINT to use. For the next 2 weeks, wear the splint all the time except when you sleep or take a bath. After 2 weeks, keep using the splint when you play sports, run, hike, walk on uneven ground, or do any activity that might injure your ankle.     Ankle exercises are described below. Begin the exercises as soon as you are able. They will make the ankle stronger to prevent new injuries. Do the exercises 5 to 10 times each day.  · Use your big toe to draw out the letters of the alphabet on the ground. Move your ankle as you make each letter.  · Sit with your leg straight out in front of you. Wrap a towel around the ball of your foot (just below your toes) and pull back. Pull hard enough to stretch the ankle. Don’t pull hard enough to cause pain. Hold the stretch for 30 seconds.  · Stand up. Rock onto the tiptoes of the injured foot and then return to the flat position. Repeat 10 times.  · Rotate   your ankle in a circle. Make 10 clockwise circles, then make 10 circles going the other way.     YOU SHOULD SEEK MEDICAL ATTENTION IMMEDIATELY, EITHER HERE OR AT THE NEAREST EMERGENCY DEPARTMENT, IF ANY OF THE FOLLOWING OCCURS:  · Your pain gets much worse.  · Your ankle or foot starts to tingle or it becomes numb.  · Your foot is cold or pale. This might mean there is a problem with circulation (blood supply).

## 2014-06-16 NOTE — ED Provider Notes (Signed)
Physician/Midlevel provider first contact with patient: 06/16/14 1246         History     Chief Complaint   Patient presents with   . Ankle Pain     Patient is a 33 y.o. female presenting with ankle pain. The history is provided by the patient. No language interpreter was used.   Ankle Pain  Location:  Ankle  Time since incident:  30 minutes  Injury: yes    Mechanism of injury: fall    Fall:     Fall occurred: patient was walking out of the door and rool her right ankle on the mat, denies hitting her head, LOC or any other injury.    Height of fall:  Standing  Ankle location:  R ankle  Pain details:     Quality:  Shooting, tearing and sharp    Radiates to:  L leg    Severity:  Moderate    Onset quality:  Sudden    Timing:  Constant    Progression:  Unchanged  Chronicity:  New  Dislocation: no    Foreign body present:  No foreign bodies  Prior injury to area:  No  Relieved by:  Nothing  Worsened by:  Bearing weight  Ineffective treatments:  None tried  Associated symptoms: decreased ROM and swelling    Associated symptoms: no numbness         Nursing (triage) note reviewed for the following pertinent information:    right ankle injury prior to arrival; states rolled it while walking     Past Medical History   Diagnosis Date   . Calculus of kidney    . Lyme disease        Past Surgical History   Procedure Laterality Date   . Lithotripsy         No family history on file.    Social  History   Substance Use Topics   . Smoking status: Never Smoker    . Smokeless tobacco: Not on file   . Alcohol Use: No       .     No Known Allergies    Discharge Medication List as of 06/16/2014  2:15 PM           Review of Systems   Constitutional: Negative.    Musculoskeletal: Positive for joint swelling and arthralgias.   Skin: Negative.    All other systems reviewed and are negative.      Physical Exam    BP: 135/61 mmHg, Heart Rate: 88, Temp: 98.3 F (36.8 C), Resp Rate: 16, SpO2: 100 %, Weight: 72.576 kg    Physical Exam    Constitutional: She is oriented to person, place, and time. She appears well-developed and well-nourished.   HENT:   Head: Normocephalic and atraumatic.   Neck: Normal range of motion.   Musculoskeletal:        Right ankle: She exhibits decreased range of motion and swelling. She exhibits no ecchymosis, no deformity, no laceration and normal pulse. Tenderness. Lateral malleolus tenderness found.        Right lower leg: She exhibits tenderness and bony tenderness. She exhibits no swelling, no edema, no deformity and no laceration.        Legs:  Neurological: She is alert and oriented to person, place, and time.   Skin: Skin is warm and dry.   Psychiatric: She has a normal mood and affect. Her behavior is normal. Thought content normal.   Nursing note and  vitals reviewed.      MDM and ED Course     ED Medication Orders     Start     Status Ordering Provider    06/16/14 1251  oxyCODONE-acetaminophen (PERCOCET) 5-325 MG per tablet 1 tablet   Once     Route: Oral  Ordered Dose: 1 tablet     Last MAR action:  Given Saleema Weppler HOANG              MDM  Number of Diagnoses or Management Options  Sprain of right ankle, unspecified ligament, initial encounter:   Diagnosis management comments: The attending signature signifies review and agreement of the history , PE, evaluation, clinical impression and discharge plan except as otherwise noted.    I, Naleigha Raimondi Albertine Grates, have been the primary provider for Erik Obey during this Emergency Dept visit.    I reviewed nursing recorded vitals and history including PMSFHX    Oxygen saturation by pulse oximetry is 95%-100%, Normal.  Interventions: None Needed.    DDX to include, but not limited to:  Sprain, fracture, dislocation  Plan:  X-ray negative for any fracture dislocation.  Patient will be placed in an ankle air cast, crutches, analgesics and nonsteroidal anti-inflammatory drugs.  Patient also referred to orthopedic if pain is not improved patient should follow up next  week with orthopedic.         Amount and/or Complexity of Data Reviewed  Tests in the radiology section of CPT: ordered and reviewed  Tests in the medicine section of CPT: ordered and reviewed  Review and summarize past medical records: yes    Risk of Complications, Morbidity, and/or Mortality  Presenting problems: low  Diagnostic procedures: low  Management options: low           Procedures    Results     ** No results found for the last 24 hours. **          Radiology Results (24 Hour)     Procedure Component Value Units Date/Time    Ankle Right 3+ Views [[id number]] Collected:  06/16/14 1401    Order Status:  Completed Updated:  06/16/14 1407    Narrative:      HISTORY: Right ankle injury    FINDINGS: AP, lateral and bilateral oblique views of the right ankle and  AP and lateral views of the right tibia/fibula were performed.     The osseous structures are normal with no fracture or focal lesion. The  mortise and talar dome are preserved. The right tibia and fibula are  normal.       Impression:       Normal right ankle, tibia and fibula.      Annamary Carolin, MD   06/16/2014 2:02 PM      Tibia Fibula Right AP and Lateral [[id number]] Collected:  06/16/14 1401    Order Status:  Completed Updated:  06/16/14 1407    Narrative:      HISTORY: Right ankle injury    FINDINGS: AP, lateral and bilateral oblique views of the right ankle and  AP and lateral views of the right tibia/fibula were performed.     The osseous structures are normal with no fracture or focal lesion. The  mortise and talar dome are preserved. The right tibia and fibula are  normal.       Impression:       Normal right ankle, tibia and fibula.      Annamary Carolin, MD  06/16/2014 2:02 PM            I have reviewed all labs and/or radiological studies. I have reviewed all xrays if any myself on the PACS system.    Clinical Impression & Disposition     Clinical Impression  Final diagnoses:   Sprain of right ankle, unspecified ligament, initial  encounter        ED Disposition     Discharge Mahlet Jergens discharge to home/self care.    Condition at disposition: Stable             Discharge Medication List as of 06/16/2014  2:15 PM      START taking these medications    Details   ibuprofen (ADVIL,MOTRIN) 800 MG tablet Take 1 tablet (800 mg total) by mouth every 8 (eight) hours as needed for Pain or Fever., Starting 06/16/2014, Until Discontinued, Print      oxyCODONE-acetaminophen (PERCOCET) 5-325 MG per tablet 1-2 tablets by mouth every 4-6 hours as needed for pain;  Do not drive or operate machinery while taking this medicine, Print                       Carney Harder [place], [place]  06/16/14 1500    Oliver Barre, MD  06/17/14 2027

## 2014-07-23 ENCOUNTER — Emergency Department: Payer: Self-pay

## 2014-07-23 ENCOUNTER — Emergency Department
Admission: EM | Admit: 2014-07-23 | Discharge: 2014-07-23 | Disposition: A | Discharge status: Home / Self Care | Payer: Self-pay | Attending: Emergency Medicine | Admitting: Emergency Medicine

## 2014-07-23 DIAGNOSIS — J40 Bronchitis, not specified as acute or chronic: Secondary | ICD-10-CM | POA: Insufficient documentation

## 2014-07-23 MED ORDER — OXYCODONE-ACETAMINOPHEN 5-325 MG PO TABS
2.0000 | ORAL_TABLET | Freq: Once | ORAL | Status: AC
Start: 2014-07-23 — End: 2014-07-23
  Administered 2014-07-23: 2 via ORAL
  Filled 2014-07-23: qty 2

## 2014-07-23 MED ORDER — ONDANSETRON 4 MG PO TBDP
4.0000 mg | ORAL_TABLET | Freq: Once | ORAL | Status: AC
Start: 2014-07-23 — End: 2014-07-23
  Administered 2014-07-23: 4 mg via ORAL
  Filled 2014-07-23: qty 1

## 2014-07-23 MED ORDER — AZITHROMYCIN 250 MG PO TABS
500.0000 mg | ORAL_TABLET | Freq: Once | ORAL | Status: AC
Start: 2014-07-23 — End: 2014-07-23
  Administered 2014-07-23: 500 mg via ORAL
  Filled 2014-07-23: qty 2

## 2014-07-23 MED ORDER — AZITHROMYCIN 250 MG PO TABS
250.0000 mg | ORAL_TABLET | Freq: Every day | ORAL | Status: AC
Start: 2014-07-23 — End: 2014-07-27

## 2014-07-23 MED ORDER — ALBUTEROL SULFATE HFA 108 (90 BASE) MCG/ACT IN AERS
2.0000 | INHALATION_SPRAY | RESPIRATORY_TRACT | Status: AC | PRN
Start: 2014-07-23 — End: 2014-08-22

## 2014-07-23 MED ORDER — OXYCODONE-ACETAMINOPHEN 5-325 MG PO TABS
ORAL_TABLET | ORAL | Status: DC
Start: 2014-07-23 — End: 2014-11-20

## 2014-07-23 NOTE — Discharge Instructions (Signed)
Bronchitis    You have been diagnosed with bronchitis.    Bronchitis is an irritation of the large breathing tubes. It can be caused by tobacco smoke, air pollution, or an infection. Patients with bronchitis are short of breath and may cough up green or yellow mucous. These symptoms are usually worse at night, when lying flat and in wet weather. Most people with bronchitis do not need antibiotics. If your doctor prescribes antibiotics, fill the prescription and take all the medicine according to the instructions.    Bronchitis is usually treated with medicine to help stop coughing. An inhaler with albuterol (Ventolin/Proventil) is sometimes used to help with cough. It is best to use the inhaler with a spacer to help the medicine reach your lungs. The doctor can prescribe a spacer.    Bronchitis is usually caused by a virus. Antibiotics do not kill viruses. In fact, antibiotics do not affect viruses in any way. In the past, some doctors prescribed antibiotics for people with bronchitis. We now know that antibiotics are not helpful for most bronchitis patients. Patients who might need antibiotics are those with lung problems that don't go away, like emphysema or COPD.    Your coughing and wheezing might last for 2 or 3 weeks! The symptoms should get better over this time period and not worse.    Your doctor prescribed an albuterol (Ventolin/Proventil) inhaler. Use it every four hours if you are wheezing, coughing, or short of breath. This will reduce symptoms.    Do not smoke. Research shows that smoking causes heart disease, cancer, and birth defects. Avoiding smoking will help your asthma. If you smoke, ask your doctor for ideas about how to stop.   If you do not smoke, avoid others who do.    YOU SHOULD SEEK MEDICAL ATTENTION IMMEDIATELY, EITHER HERE OR AT THE NEAREST EMERGENCY DEPARTMENT, IF ANY OF THE FOLLOWING OCCURS:   You wheeze or have trouble breathing.   You have a fever (temperature higher  than 100.67F / 38C), that won't go away.   You have chest pain.   You vomit or cannot keep liquids down or you feel weak or dizzy.   Your symptoms get worse over the next 2 or 3 days.            Narcotic (Opiate) Overuse Information (Edu)    You were seen in the Emergency Department today because of pain. Pain is often a sign that something in the body is not working right. Sometimes we find the cause of the pain. In other cases, the cause of the pain is not found. Pain medication can be given to help with pain.    Today, your doctor went over your pain medication profile with you. This report tells the doctor how many narcotic (opiate) and sedative prescriptions you have filled in the last year in your state.    Your doctor is worried that you are getting pain medication prescriptions very often from several doctors. This is very dangerous because patients can accidentally overdose on medications. This can cause death, serious disability, and/or brain damage that can't be fixed. If you need pain medication often for your medical condition, you need to see one doctor (your primary care doctor or even a pain specialist). This way, the doctor can correctly adjust your pain medication and watch you for any complications.     The Emergency Department can't give narcotic or sedative prescriptions for ongoing or chronic conditions. It is beyond what we can  do as Emergency Department doctors. We do not have a way to properly check what level of pain medication you need. Also, we cannot follow up you over time for your chronic medical condition.     At this point, your doctors think you are safe to go home. It is important to watch your pain. We don't believe your condition is serious right now. Still, it is important to be careful. Sometimes a problem that seems small can get serious later. This is why it is very important to come back here or go to the nearest Emergency Department if you don t get better or your  symptoms get worse.    Take all your pain medications as prescribed. Do not take them more often or increase your dose; you could accidentally overdose. This can cause death, serious disability, and possibly brain death that cannot be fixed.     Keep all your doctor s appointments. Following up with your primary care doctor or your pain specialist is very important.    YOU SHOULD SEEK MEDICAL ATTENTION IMMEDIATELY, EITHER HERE OR AT THE NEAREST EMERGENCY DEPARTMENT, IF ANY OF THE FOLLOWING OCCUR:   You have confusion, disorientation (not sure where you are), or trouble waking up after taking these pain medications.   You have blue lips, trouble breathing, shallow breaths, or fewer breaths than normal.            Sedating Medication (Edu)    You have been given a medicine or prescription for medicine that causes drowsiness (sleepiness) or dizziness. While taking this medicine, DO NOT drive a car. DO NOT operate machinery. DO NOT perform jobs where you need to be alert.    DO NOT drink alcoholic beverages while taking this medicine.    If you get dizzy, sit or lie down at the first signs. Be careful going up and down stairs.    Never give this medicine to others.    Keep this medicine out of reach of children.    Do not take or save old medicines. Throw them away when outdated.    Keep all medicines in a cool, dry place. DO NOT keep them in your bathroom medicine cabinet or in a cabinet above the stove.

## 2014-07-23 NOTE — ED Provider Notes (Signed)
Physician/Midlevel provider first contact with patient: 07/23/14 0047         History     Chief Complaint   Patient presents with   . Cough     Patient is a 33 y.o. female presenting with cough. The history is provided by the patient.   Cough  Cough characteristics:  Productive  Sputum characteristics:  Nondescript  Severity:  Severe  Onset quality:  Gradual  Duration:  2 weeks  Timing:  Constant  Progression:  Worsening (Getting worse the last 2-3 days)  Chronicity:  New  Smoker: no    Context: upper respiratory infection and with activity    Context: not animal exposure, not exposure to allergens, not fumes, not occupational exposure, not sick contacts, not smoke exposure and not weather changes    Relieved by:  Nothing  Worsened by:  Nothing tried  Ineffective treatments:  None tried  Associated symptoms: chest pain, chills, headaches, rhinorrhea, shortness of breath and sinus congestion    Associated symptoms: no diaphoresis, no ear fullness, no ear pain, no eye discharge, no fever, no myalgias, no rash, no weight loss and no wheezing    Chest pain:     Quality:  Aching    Severity:  Mild    Onset quality:  Gradual    Timing:  Constant (Worsens only with coughing)    Progression:  Unchanged    Chronicity:  New  Headaches:     Severity:  Mild    Onset quality:  Gradual    Timing:  Intermittent  Rhinorrhea:     Severity:  Mild  Shortness of breath:     Severity:  Moderate    Onset quality:  Gradual    Timing:  Intermittent    Progression:  Unchanged  Risk factors: recent infection    Risk factors: no chemical exposure and no recent travel             Past Medical History   Diagnosis Date   . Calculus of kidney    . Lyme disease        Past Surgical History   Procedure Laterality Date   . Lithotripsy         History reviewed. No pertinent family history.    Social  History   Substance Use Topics   . Smoking status: Never Smoker    . Smokeless tobacco: Not on file   . Alcohol Use: No       .     No Known  Allergies    Discharge Medication List as of 07/23/2014  1:59 AM           Review of Systems   Constitutional: Positive for chills. Negative for fever, weight loss and diaphoresis.   HENT: Positive for congestion and rhinorrhea. Negative for ear pain.    Eyes: Negative for discharge.   Respiratory: Positive for cough and shortness of breath. Negative for wheezing.    Cardiovascular: Positive for chest pain.   Gastrointestinal: Negative for nausea, vomiting and abdominal pain.   Genitourinary: Negative for dysuria.        Denies pregnancy   Musculoskeletal: Negative for myalgias.   Skin: Negative for rash.   Neurological: Positive for headaches. Negative for light-headedness.   Hematological: Does not bruise/bleed easily.   Psychiatric/Behavioral: The patient is not nervous/anxious.        Physical Exam    BP: 139/84 mmHg, Heart Rate: 91, Temp: 98 F (36.7 C), Resp Rate: 19, SpO2:  100 %, Weight: 72.576 kg    Physical Exam   Constitutional: She is oriented to person, place, and time. She appears well-developed and well-nourished.   HENT:   Head: Normocephalic and atraumatic.   Right Ear: External ear normal.   Left Ear: External ear normal.   Mouth/Throat: Mucous membranes are not dry. Posterior oropharyngeal erythema present. No oropharyngeal exudate or posterior oropharyngeal edema.   Cardiovascular: Normal rate, regular rhythm, normal heart sounds and intact distal pulses.  Exam reveals no friction rub.    No murmur heard.  Pulmonary/Chest: Effort normal. No respiratory distress. She has no wheezes. She has no rales. She exhibits tenderness.   Mild rhonchi, positive chest wall tenderness with palpation.  No crepitus   Abdominal: Soft. Bowel sounds are normal. There is no tenderness. There is no guarding.   Musculoskeletal: Normal range of motion. She exhibits no edema or tenderness.   No Homans sign.  Negative calf tenderness   Neurological: She is alert and oriented to person, place, and time. No cranial nerve  deficit. Gait normal. GCS eye subscore is 4. GCS verbal subscore is 5. GCS motor subscore is 6.   Skin: Skin is warm and dry.   Psychiatric: She has a normal mood and affect. Her behavior is normal.   Nursing note and vitals reviewed.        MDM and ED Course     ED Medication Orders     Start     Status Ordering Provider    07/23/14 0200  azithromycin (ZITHROMAX) tablet 500 mg   Once     Route: Oral  Ordered Dose: 500 mg     Last MAR action:  Given Ersilia Brawley    07/23/14 0105  oxyCODONE-acetaminophen (PERCOCET) 5-325 MG per tablet 2 tablet   Once     Route: Oral  Ordered Dose: 2 tablet     Last MAR action:  Given Denai Caba    07/23/14 0105  ondansetron (ZOFRAN-ODT) disintegrating tablet 4 mg   Once     Route: Oral  Ordered Dose: 4 mg     Last MAR action:  Given Sabriyah Wilcher         XR Chest 2 Views   Final Result      No acute abnormality.      Johnsie Kindred, MD    07/23/2014 1:28 AM                MDM  Number of Diagnoses or Management Options  Bronchitis:   Diagnosis management comments: Dr. Cathlean Marseilles  is the primary attending for this patient and has obtained and performed the history, PE, and medical decision making for this patient.      Oxygen saturation by pulse oximetry is 95%-100%, Normal.  Interventions: Patient Observed.    Differential diagnoses bronchitis, bronchospasm, pneumonia, viral syndrome.  Discussed medication.  X-ray of antibiotics.  I think patient does need antibiotics, I recommended a nebulizer.  Patient would prefer not to use a nebulizer would like to use homeopathic medications.  This was able to take the Percocet secondary to the cough and the chest discomfort.  Patient's had that before for kidney stones.  During the ER observation for one hour.  Patient's feeling better.  Cough is much improving.  I recommended close follow-up if worsening signs or symptoms.  I did give her inhaler prescription.  Amount and/or Complexity of Data  Reviewed  Tests in the radiology section of CPT: ordered and reviewed           Procedures    Clinical Impression & Disposition     Clinical Impression  Final diagnoses:   Bronchitis        ED Disposition     Discharge Monserrat Vidaurri discharge to home/self care.    Condition at disposition: Stable             Discharge Medication List as of 07/23/2014  1:59 AM      START taking these medications    Details   albuterol (PROVENTIL HFA;VENTOLIN HFA) 108 (90 BASE) MCG/ACT inhaler Inhale 2 puffs into the lungs every 4 (four) hours as needed for Wheezing or Shortness of Breath (coughing). Dispense with spacer, Starting 07/23/2014, Until Sun 08/22/14, Print      azithromycin (ZITHROMAX) 250 MG tablet Take 1 tablet (250 mg total) by mouth daily. Start tomorrow, Starting 07/23/2014, Until Tue 07/27/14, Print      oxyCODONE-acetaminophen (PERCOCET) 5-325 MG per tablet 1-2 tablets by mouth every 4-6 hours as needed for pain;  Do not drive or operate machinery while taking this medicine, Print                         Leonia Reeves, MD  07/23/14 2302

## 2014-11-20 ENCOUNTER — Emergency Department: Payer: Self-pay

## 2014-11-20 ENCOUNTER — Emergency Department
Admission: EM | Admit: 2014-11-20 | Discharge: 2014-11-21 | Disposition: A | Discharge status: Home / Self Care | Payer: Self-pay | Attending: Emergency Medicine | Admitting: Emergency Medicine

## 2014-11-20 DIAGNOSIS — M5416 Radiculopathy, lumbar region: Secondary | ICD-10-CM | POA: Insufficient documentation

## 2014-11-20 MED ORDER — OXYCODONE-ACETAMINOPHEN 5-325 MG PO TABS
1.0000 | ORAL_TABLET | Freq: Once | ORAL | Status: AC
Start: 2014-11-20 — End: 2014-11-20
  Administered 2014-11-20: 1 via ORAL
  Filled 2014-11-20: qty 1

## 2014-11-20 NOTE — ED Notes (Signed)
Pt c/o mid line back pain that radiates to bottom.  Pt has hx of back pain but today states that it is worse.  Pt rates pain an 8 out of 10.  Tried Motrin at home but no relief.  Pt denies fevers/chills, SOB, chest pain.

## 2014-11-21 ENCOUNTER — Emergency Department: Payer: Self-pay

## 2014-11-21 MED ORDER — METHYLPREDNISOLONE 4 MG PO KIT
PACK | ORAL | Status: AC
Start: 2014-11-21 — End: ?

## 2014-11-21 MED ORDER — HYDROCODONE-ACETAMINOPHEN 5-325 MG PO TABS
1.0000 | ORAL_TABLET | Freq: Four times a day (QID) | ORAL | Status: DC | PRN
Start: 2014-11-21 — End: 2017-11-21

## 2014-11-21 NOTE — Discharge Instructions (Signed)
Lumbar Radiculopathy    You have been seen for a lumbar radiculopathy.     Your spine has bones called "vertebrae." In between the bones there are soft cushions. These are called "discs." The discs keep the vertebrae from rubbing against each other. Inside of each disc is a thick, jelly-like substance called the "nucleus pulposus." Sometimes when the spine is injured, a disc is damaged and the nucleus pulposus leaks out of the disc. This is called a "disc herniation." As people age, the discs get brittle and delicate. If this happens, you might have a disc herniation. This is possible even if you don t remember getting injured.    Sometimes a herniated disc presses on a nerve in the spine. This causes pain near the herniated disc. Since you have symptoms in your lower back and leg, the problem is in the lumbar (lower back) spine. Other problems can cause a radiculopathy (nerve pain), including a narrowed opening where the nerves come out.    Some symptoms of a lumbar radiculopathy are:     Pain down one of your legs.   A burning feeling down your leg.   Numbness (pins and needles or loss of feeling) in your leg.   In serious cases, your leg may feel weak.    This problem is often treated with rest, physical therapy, and medication for the swelling and pain. If these treatments don t work, surgery may be needed. Sometimes taking steroids (like Prednisone) for a few days can help the pain.    We don't believe your condition is dangerous right now. However, you need to be careful. Sometimes a problem that seems small can get serious later. Therefore, it is very important for you to come back here or go to the nearest Emergency Department if you don t get better or your symptoms get worse.     You may have been referred to get an MRI of your spine or an EMG. These tests look at your problem more closely.    Follow up with your doctor or the referral doctor as soon as possible.    YOU SHOULD SEEK MEDICAL  ATTENTION IMMEDIATELY, EITHER HERE OR AT THE NEAREST EMERGENCY DEPARTMENT, IF ANY OF THE FOLLOWING OCCURS:     You lose bowel or bladder control (you soil or wet yourself).   You feel week or can t use your leg(s).   The medication doesn t help the pain.   You have a fever (temperature higher than 100.4F or 38C) or shaking chills.   You have serious pain over one bone (vertebra) in your back.    If you can t follow up with your doctor, or if at any time you feel you need to be rechecked or seen again, come back here or go to the nearest emergency department.

## 2014-11-22 NOTE — ED Provider Notes (Signed)
Physician/Midlevel provider first contact with patient: 11/20/14 2340         History     Chief Complaint   Patient presents with   . Back Pain     Patient is a 34 y.o. female presenting with back pain. The history is provided by the patient.   Back Pain  Location:  Lumbar spine  Quality:  Aching  Radiates to:  R posterior upper leg  Pain severity:  Severe  Onset quality:  Gradual  Duration: chronic but worse in last week.  Timing:  Constant  Progression:  Worsening  Chronicity:  Recurrent  Context: not physical stress and not recent injury    Relieved by:  Nothing  Worsened by:  Sitting  Ineffective treatments:  NSAIDs and being still  Associated symptoms: leg pain    Associated symptoms: no abdominal pain, no bladder incontinence, no bowel incontinence, no chest pain, no dysuria, no fever, no numbness, no paresthesias, no perianal numbness and no weakness     Hx of similar pain in past.        Past Medical History   Diagnosis Date   . Calculus of kidney    . Lyme disease        Past Surgical History   Procedure Laterality Date   . Lithotripsy         History reviewed. No pertinent family history.    Social  History   Substance Use Topics   . Smoking status: Never Smoker    . Smokeless tobacco: Not on file   . Alcohol Use: No       .     No Known Allergies    Home Medications     Last Medication Reconciliation Action:  In Progress Dena Billet, RN 11/20/2014 11:09 PM          No Medications           Review of Systems   Constitutional: Positive for activity change. Negative for fever.   Respiratory: Negative for shortness of breath.    Cardiovascular: Negative for chest pain and leg swelling.   Gastrointestinal: Negative for nausea, vomiting, abdominal pain and bowel incontinence.   Genitourinary: Negative for bladder incontinence and dysuria.   Musculoskeletal: Positive for back pain.   Skin: Negative for rash.   Neurological: Negative for weakness, numbness and paresthesias.   All other systems reviewed and  are negative.      Physical Exam    BP: 142/88 mmHg, Heart Rate: (!) 107, Temp: 99 F (37.2 C), Resp Rate: 16, SpO2: 99 %, Weight: 74.844 kg    Physical Exam   Constitutional: She is oriented to person, place, and time. Vital signs are normal. She appears well-developed and well-nourished. No distress.   Sitting up on stretcher w/ legs extended in front   HENT:   Head: Normocephalic and atraumatic.   Mouth/Throat: Oropharynx is clear and moist.   Eyes: Conjunctivae and EOM are normal.   Neck: Normal range of motion. Neck supple.   Cardiovascular: Normal rate, regular rhythm, normal heart sounds and intact distal pulses.    Pulmonary/Chest: Effort normal and breath sounds normal. No respiratory distress.   Abdominal: Soft. Bowel sounds are normal. There is no tenderness.   Musculoskeletal: Normal range of motion. She exhibits tenderness (midline lumbar). She exhibits no edema.   No buttocks tenderness.   Neurological: She is alert and oriented to person, place, and time. She has normal strength. No sensory deficit. GCS eye  subscore is 4. GCS verbal subscore is 5. GCS motor subscore is 6.   Neg SLR   Skin: Skin is warm and dry. No rash noted. No pallor.   Psychiatric: She has a normal mood and affect. Her behavior is normal.   Nursing note and vitals reviewed.        MDM and ED Course     ED Medication Orders     Start Ordered     Status Ordering Provider    11/20/14 2351 11/20/14 2350  oxyCODONE-acetaminophen (PERCOCET) 5-325 MG per tablet 1 tablet   Once     Route: Oral  Ordered Dose: 1 tablet     Last MAR action:  Given Phillippe Orlick A              MDM  Number of Diagnoses or Management Options  Lumbar back pain with radiculopathy affecting right lower extremity:      Amount and/or Complexity of Data Reviewed  Tests in the radiology section of CPT: ordered and reviewed  Review and summarize past medical records: yes    I, Kalman Drape MD, have been the primary provider for Erik Obey during this Emergency  Dept visit.    Oxygen saturation by pulse oximetry is 95%-100% on RA, Normal.  Interventions: Patient Observed.    Pt w/ mixed symptoms of lumbar disk herniation/ radiculopathy, muscle strain, sciatica.  Will treat with steroids, opiate analgesia, and recommended f/u.      Results for orders placed or performed during the hospital encounter of 11/20/14   XR Lumbar Spine AP And Lateral    Narrative    HISTORY:  Back pain    COMPARISON:  02/25/2010    FINDINGS: AP, lateral, and coned-down lateral views of the lumbar spine  were obtained.  The lumbar vertebral bodies demonstrate normal height,  contour, and alignment. There is stable, mild disc space height loss at  L4-5. Pedicles are gross unremarkable. 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 The sacroiliac joints are within normal limits.        Impression     Negative for acute process. Redemonstration of L5  spondylolysis.    Gerlene Burdock, MD   11/21/2014 12:41 AM              Procedures    Clinical Impression & Disposition     Clinical Impression  Final diagnoses:   Lumbar back pain with radiculopathy affecting right lower extremity        ED Disposition     Discharge Erik Obey discharge to home/self care.    Condition at disposition: Stable             Discharge Medication List as of 11/21/2014 12:52 AM      START taking these medications    Details   HYDROcodone-acetaminophen (NORCO) 5-325 MG per tablet Take 1 tablet by mouth every 6 (six) hours as needed for Pain., Starting 11/21/2014, Until Discontinued, Print      methylPREDNISolone (MEDROL DOSEPACK) 4 MG tablet Use as directed, Print                         Kalman Drape, MD  11/25/14 2113

## 2015-01-17 ENCOUNTER — Encounter: Payer: Self-pay | Admitting: Urgent Care

## 2015-01-17 DIAGNOSIS — L52 Erythema nodosum: Secondary | ICD-10-CM | POA: Insufficient documentation

## 2015-01-17 DIAGNOSIS — Z3202 Encounter for pregnancy test, result negative: Secondary | ICD-10-CM | POA: Insufficient documentation

## 2015-01-17 DIAGNOSIS — N760 Acute vaginitis: Secondary | ICD-10-CM | POA: Insufficient documentation

## 2015-01-17 NOTE — ED Notes (Signed)
Patient presents with c/o a "weird rash" to her lumbosacral area x 3 days. Rash now on LLE. Patient advises that the rash causes her to have hip pain and "female issues".

## 2015-01-18 ENCOUNTER — Emergency Department
Admission: EM | Admit: 2015-01-18 | Discharge: 2015-01-18 | Disposition: A | Discharge status: Home / Self Care | Payer: Self-pay | Attending: Emergency Medicine | Admitting: Emergency Medicine

## 2015-01-18 DIAGNOSIS — N760 Acute vaginitis: Secondary | ICD-10-CM

## 2015-01-18 DIAGNOSIS — B9689 Other specified bacterial agents as the cause of diseases classified elsewhere: Secondary | ICD-10-CM

## 2015-01-18 DIAGNOSIS — L52 Erythema nodosum: Secondary | ICD-10-CM

## 2015-01-18 LAB — COMPREHENSIVE METABOLIC PANEL
ALBUMIN: 4.1 g/dL (ref 3.5–5.0)
ALT: 30 U/L (ref 14–54)
ANION GAP: 8 (ref 5–15)
AST: 23 U/L (ref 15–41)
Alkaline Phosphatase: 67 U/L (ref 38–126)
BUN: 19 mg/dL (ref 6–20)
CHLORIDE: 105 mmol/L (ref 101–111)
CO2: 26 mmol/L (ref 22–32)
Calcium: 9.1 mg/dL (ref 8.9–10.3)
Creatinine, Ser: 0.81 mg/dL (ref 0.44–1.00)
GFR calc non Af Amer: >60 mL/min (ref >60)
GLUCOSE: 99 mg/dL (ref 65–99)
Potassium: 3.4 mmol/L — ABNORMAL LOW (ref 3.5–5.1)
Sodium: 139 mmol/L (ref 135–145)
Total Protein: 6.9 g/dL (ref 6.5–8.1)

## 2015-01-18 LAB — PROTIME-INR
INR: 0.89
Prothrombin Time: 12.2 seconds (ref 11.4–15.0)

## 2015-01-18 LAB — CBC
HCT: 39 % (ref 35.0–47.0)
HEMOGLOBIN: 13.1 g/dL (ref 12.0–16.0)
MCH: 31.5 pg (ref 26.0–34.0)
MCHC: 33.6 g/dL (ref 32.0–36.0)
MCV: 94 fL (ref 80.0–100.0)
Platelets: 201 10*3/uL (ref 150–440)
RBC: 4.15 MIL/uL (ref 3.80–5.20)
RDW: 12.7 % (ref 11.5–14.5)
WBC: 9.1 10*3/uL (ref 3.6–11.0)

## 2015-01-18 LAB — URINALYSIS COMPLETE WITH MICROSCOPIC (ARMC ONLY)
Bacteria, UA: NONE SEEN
Bilirubin Urine: NEGATIVE
Glucose, UA: NEGATIVE mg/dL
HGB URINE DIPSTICK: NEGATIVE
Ketones, ur: NEGATIVE mg/dL
Leukocytes, UA: NEGATIVE
NITRITE: NEGATIVE
Protein, ur: NEGATIVE mg/dL
SPECIFIC GRAVITY, URINE: 1.017 (ref 1.005–1.030)
pH: 6 (ref 5.0–8.0)

## 2015-01-18 LAB — PREGNANCY, URINE: Preg Test, Ur: NEGATIVE

## 2015-01-18 LAB — CHLAMYDIA/NGC RT PCR (ARMC ONLY)
Chlamydia Tr: NOT DETECTED
N gonorrhoeae: NOT DETECTED

## 2015-01-18 LAB — WET PREP, GENITAL
Trich, Wet Prep: NONE SEEN
Yeast Wet Prep HPF POC: NONE SEEN

## 2015-01-18 MED ORDER — METRONIDAZOLE 500 MG PO TABS
ORAL_TABLET | ORAL | Status: AC
  Administered 2015-01-18: 500 mg via ORAL
  Filled 2015-01-18: qty 1

## 2015-01-18 MED ORDER — METRONIDAZOLE 500 MG PO TABS: 500.0000 mg | ORAL_TABLET | Freq: Two times a day (BID) | ORAL | Status: AC

## 2015-01-18 MED ORDER — PREDNISONE 10 MG PO TABS: 20.0000 mg | ORAL_TABLET | Freq: Every day | ORAL | Status: AC

## 2015-01-18 MED ORDER — METRONIDAZOLE 500 MG PO TABS
500.0000 mg | ORAL_TABLET | Freq: Once | ORAL | Status: AC
  Administered 2015-01-18: 500 mg via ORAL

## 2015-01-18 NOTE — ED Notes (Addendum)
Pt uprite on stretcher in exam room with no distress noted; pt reports painful rash x 3 days ; small purplish areas noted just over left hip and left lower leg; also st last 1-2wks c/o bilat ovarian pain, vag discharge (green/yellow) and burning with odor

## 2015-01-18 NOTE — Discharge Instructions (Signed)
Bacterial Vaginosis Bacterial vaginosis is a vaginal infection that occurs when the normal balance of bacteria in the vagina is disrupted. It results from an overgrowth of certain bacteria. This is the most common vaginal infection in women of childbearing age. Treatment is important to prevent complications, especially in pregnant women, as it can cause a premature delivery. CAUSES  Bacterial vaginosis is caused by an increase in harmful bacteria that are normally present in smaller amounts in the vagina. Several different kinds of bacteria can cause bacterial vaginosis. However, the reason that the condition develops is not fully understood. RISK FACTORS Certain activities or behaviors can put you at an increased risk of developing bacterial vaginosis, including:  Having a new sex partner or multiple sex partners.  Douching.  Using an intrauterine device (IUD) for contraception. Women do not get bacterial vaginosis from toilet seats, bedding, swimming pools, or contact with objects around them. SIGNS AND SYMPTOMS  Some women with bacterial vaginosis have no signs or symptoms. Common symptoms include:  Grey vaginal discharge.  A fishlike odor with discharge, especially after sexual intercourse.  Itching or burning of the vagina and vulva.  Burning or pain with urination. DIAGNOSIS  Your health care provider will take a medical history and examine the vagina for signs of bacterial vaginosis. A sample of vaginal fluid may be taken. Your health care provider will look at this sample under a microscope to check for bacteria and abnormal cells. A vaginal pH test may also be done.  TREATMENT  Bacterial vaginosis may be treated with antibiotic medicines. These may be given in the form of a pill or a vaginal cream. A second round of antibiotics may be prescribed if the condition comes back after treatment.  HOME CARE INSTRUCTIONS   Only take over-the-counter or prescription medicines as  directed by your health care provider.  If antibiotic medicine was prescribed, take it as directed. Make sure you finish it even if you start to feel better.  Do not have sex until treatment is completed.  Tell all sexual partners that you have a vaginal infection. They should see their health care provider and be treated if they have problems, such as a mild rash or itching.  Practice safe sex by using condoms and only having one sex partner. SEEK MEDICAL CARE IF:   Your symptoms are not improving after 3 days of treatment.  You have increased discharge or pain.  You have a fever. MAKE SURE YOU:   Understand these instructions.  Will watch your condition.  Will get help right away if you are not doing well or get worse. FOR MORE INFORMATION  Centers for Disease Control and Prevention, Division of STD Prevention: [employer]Apps[employer] American Sexual Health Association (ASHA): www.ashastd.org  Document Released: 07/23/2005 Document Revised: 05/13/2013 Document Reviewed: 03/04/2013 [facility] Patient Information 2015 [place], [place]. This information is not intended to replace advice given to you by your health care provider. Make sure you discuss any questions you have with your health care provider.  Erythema Nodosum Erythema nodosum is also called "painful red nodules on the legs". Symptoms can include sudden onset of fever, fatigue and joint pains. Red, deep, tender, raised, bruise-like bumps form on the shin, or sometimes on the arms or trunk. The skin over the bumps is usually shiny. The symptoms usually last 1-2 weeks. The symptoms usually improve once the cause is treated. This illness may be caused by strep or fungal infection, drug reaction, pregnancy, or other medical conditions. Treating the cause  is important to early recovery. Erythema nodosum occurs at any age and in both sexes but more often in young adult women. Erythema nodosum is not a skin infection. It is a reaction to  something internal. In most cases the illness and bumps go away with treatment. You should avoid any medicine that may have caused this reaction. Antihistamine drugs, anti-inflammatory drugs or cortisone medicine may be prescribed to relieve symptoms. SSKI drops ([place]) taken with juice at breakfast, lunch and dinner may have an anti-inflammatory effect and speed the healing. Bed rest and limiting vigorous exercise help to shorten the course of erythema nodosum. Elevation of the affected limb also helps in recovery. As the condition gets better, the bumps flatten and your skin may heal with temporary bruise marks. These dark marks will clear up in several months and they are a good sign that your skin is healing.  SEEK IMMEDIATE MEDICAL CARE IF:   Your condition worsens, or if you have more severe symptoms such a high fever, sore throat, or repeated vomiting. Document Released: 08/30/2004 Document Revised: 10/15/2011 Document Reviewed: 09/03/2008 [facility] Patient Information 2015 [place], [place]. This information is not intended to replace advice given to you by your health care provider. Make sure you discuss any questions you have with your health care provider.

## 2015-01-18 NOTE — ED Provider Notes (Signed)
[facility] Emergency Department Provider Note  ____________________________________________  Time seen: 2:45 AM  I have reviewed the triage vital signs and the nursing notes.   HISTORY  Chief Complaint Rash      HPI Katelyn Dixon is a 34 y.o. female presents with painful rash on buttocks and lower extremity 3 days patient says areas are like a bruise very tender to touch but does not recall any injury. In addition patient also admits to greenish yellow vaginal discharge with burning and odor 2 weeks.     History reviewed. No pertinent past medical history.  There are no active problems to display for this patient.   Past Surgical History  Procedure Laterality Date  . Lithotripsy      Current Outpatient Rx  Name  Route  Sig  Dispense  Refill  . metroNIDAZOLE (FLAGYL) 500 MG tablet   Oral   Take 1 tablet (500 mg total) by mouth 2 (two) times daily.   14 tablet   0   . predniSONE (DELTASONE) 10 MG tablet   Oral   Take 2 tablets (20 mg total) by mouth daily.   10 tablet   0     Allergies Review of patient's allergies indicates no known allergies.  No family history on file.  Social History History  Substance Use Topics  . Smoking status: Never Smoker   . Smokeless tobacco: Not on file  . Alcohol Use: No    Review of Systems  Constitutional: Negative for fever. Eyes: Negative for visual changes. ENT: Negative for sore throat. Cardiovascular: Negative for chest pain. Respiratory: Negative for shortness of breath. Gastrointestinal: Negative for abdominal pain, vomiting and diarrhea. Genitourinary: Positive vaginal discharge Musculoskeletal: Negative for back pain. Skin: Positive for rash. Neurological: Negative for headaches, focal weakness or numbness.   10-point ROS otherwise negative.  ____________________________________________   PHYSICAL EXAM:  VITAL SIGNS: ED Triage Vitals  Enc Vitals Group     BP  01/17/15 2318 136/83 mmHg     Pulse Rate 01/17/15 2318 90     Resp 01/17/15 2318 18     Temp 01/17/15 2318 98.4 F (36.9 C)     Temp Source 01/17/15 2318 Oral     SpO2 01/17/15 2318 99 %     Weight 01/17/15 2318 170 lb (77.111 kg)     Height 01/17/15 2318 5\' 5"  (1.651 m)     Head Cir --      Peak Flow --      Pain Score 01/17/15 2318 7     Pain Loc --      Pain Edu? --      Excl. in GC? --      Constitutional: Alert and oriented. Well appearing and in no distress. Eyes: Conjunctivae are normal. PERRL. Normal extraocular movements. ENT   Head: Normocephalic and atraumatic.   Nose: No congestion/rhinnorhea.   Mouth/Throat: Mucous membranes are moist.   Neck: No stridor. Hematological/Lymphatic/Immunilogical: No cervical lymphadenopathy. Cardiovascular: Normal rate, regular rhythm. Normal and symmetric distal pulses are present in all extremities. No murmurs, rubs, or gallops. Respiratory: Normal respiratory effort without tachypnea nor retractions. Breath sounds are clear and equal bilaterally. No wheezes/rales/rhonchi. Gastrointestinal: Soft and nontender. No distention. There is no CVA tenderness. Genitourinary: Yellow vaginal discharge Musculoskeletal: Nontender with normal range of motion in all extremities. No joint effusions.  No lower extremity tenderness nor edema. Neurologic:  Normal speech and language. No gross focal neurologic deficits are appreciated. Speech is normal.  Skin:  Multiple areas noted on patient's left gluteal and left lower extremity purple purpura that is painful Psychiatric: Mood and affect are normal. Speech and behavior are normal. Patient exhibits appropriate insight and judgment.  ____________________________________________    LABS (pertinent positives/negatives)  Labs Reviewed  WET PREP, GENITAL - Abnormal; Notable for the following:    Clue Cells Wet Prep HPF POC MANY (*)    WBC, Wet Prep HPF POC MANY (*)    All other  components within normal limits  COMPREHENSIVE METABOLIC PANEL - Abnormal; Notable for the following:    Potassium 3.4 (*)    Total Bilirubin <0.1 (*)    All other components within normal limits  URINALYSIS COMPLETEWITH MICROSCOPIC ([facility] ONLY) - Abnormal; Notable for the following:    Color, Urine YELLOW (*)    APPearance CLEAR (*)    Squamous Epithelial / LPF 0-5 (*)    All other components within normal limits  CHLAMYDIA/NGC RT PCR ([facility] ONLY)  CBC  PROTIME-INR  PREGNANCY, URINE         INITIAL IMPRESSION / ASSESSMENT AND PLAN / ED COURSE  Pertinent labs & imaging results that were available during my care of the patient were reviewed by me and considered in my medical decision making (see chart for details).  History physical exam consistent with erythema nodosum patient will be referred to Dr. Hyacinth Meeker primary care for outpatient follow-up. Vaginal swabs reveal bacterial vaginosis patient prescribed Flagyl  ____________________________________________   FINAL CLINICAL IMPRESSION(S) / ED DIAGNOSES  Final diagnoses:  Erythema nodosum  BV (bacterial vaginosis)      Darci Current, MD 01/18/15 [phone]

## 2016-03-13 ENCOUNTER — Encounter (HOSPITAL_COMMUNITY): Payer: Self-pay

## 2016-03-13 ENCOUNTER — Emergency Department (HOSPITAL_COMMUNITY)
Admission: EM | Admit: 2016-03-13 | Discharge: 2016-03-14 | Disposition: A | Discharge status: Home / Self Care | Payer: No Typology Code available for payment source | Attending: Emergency Medicine | Admitting: Emergency Medicine

## 2016-03-13 ENCOUNTER — Emergency Department (HOSPITAL_COMMUNITY): Payer: No Typology Code available for payment source

## 2016-03-13 DIAGNOSIS — S39012A Strain of muscle, fascia and tendon of lower back, initial encounter: Secondary | ICD-10-CM | POA: Insufficient documentation

## 2016-03-13 DIAGNOSIS — S161XXA Strain of muscle, fascia and tendon at neck level, initial encounter: Secondary | ICD-10-CM | POA: Insufficient documentation

## 2016-03-13 DIAGNOSIS — S199XXA Unspecified injury of neck, initial encounter: Secondary | ICD-10-CM | POA: Diagnosis present

## 2016-03-13 DIAGNOSIS — Y999 Unspecified external cause status: Secondary | ICD-10-CM | POA: Insufficient documentation

## 2016-03-13 DIAGNOSIS — Y939 Activity, unspecified: Secondary | ICD-10-CM | POA: Diagnosis not present

## 2016-03-13 DIAGNOSIS — Y92410 Unspecified street and highway as the place of occurrence of the external cause: Secondary | ICD-10-CM | POA: Insufficient documentation

## 2016-03-13 DIAGNOSIS — S29019A Strain of muscle and tendon of unspecified wall of thorax, initial encounter: Secondary | ICD-10-CM

## 2016-03-13 MED ORDER — CYCLOBENZAPRINE HCL 10 MG PO TABS
10.0000 mg | ORAL_TABLET | Freq: Once | ORAL | Status: AC
  Administered 2016-03-13: 10 mg via ORAL
  Filled 2016-03-13: qty 1

## 2016-03-13 MED ORDER — HYDROCODONE-ACETAMINOPHEN 5-325 MG PO TABS
ORAL_TABLET | ORAL | Status: AC
  Filled 2016-03-13: qty 1
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
## 2016-03-13 NOTE — ED Notes (Signed)
Patient transported to X-ray 

## 2016-03-14 MED ORDER — KETOROLAC TROMETHAMINE 60 MG/2ML IM SOLN
INTRAMUSCULAR | Status: AC
  Filled 2016-03-14: qty 2

## 2016-03-14 NOTE — ED Notes (Signed)
Pt left during downtime, please see paper charting for medications and d/c vitals.  Pain improved with medication and pt was ambulatory when she left with her husband

## 2016-07-27 ENCOUNTER — Emergency Department (HOSPITAL_COMMUNITY): Payer: No Typology Code available for payment source

## 2016-07-27 ENCOUNTER — Emergency Department (HOSPITAL_COMMUNITY)
Admission: EM | Admit: 2016-07-27 | Discharge: 2016-07-27 | Disposition: A | Discharge status: Home / Self Care | Payer: No Typology Code available for payment source | Attending: Emergency Medicine | Admitting: Emergency Medicine

## 2016-07-27 ENCOUNTER — Encounter (HOSPITAL_COMMUNITY): Payer: Self-pay | Admitting: Emergency Medicine

## 2016-07-27 DIAGNOSIS — Y929 Unspecified place or not applicable: Secondary | ICD-10-CM | POA: Diagnosis not present

## 2016-07-27 DIAGNOSIS — S161XXA Strain of muscle, fascia and tendon at neck level, initial encounter: Secondary | ICD-10-CM | POA: Diagnosis not present

## 2016-07-27 DIAGNOSIS — Y939 Activity, unspecified: Secondary | ICD-10-CM | POA: Diagnosis not present

## 2016-07-27 DIAGNOSIS — S199XXA Unspecified injury of neck, initial encounter: Secondary | ICD-10-CM | POA: Diagnosis present

## 2016-07-27 DIAGNOSIS — Y999 Unspecified external cause status: Secondary | ICD-10-CM | POA: Insufficient documentation

## 2016-07-27 MED ORDER — DIAZEPAM 5 MG PO TABS: 5.0000 mg | ORAL_TABLET | Freq: Two times a day (BID) | ORAL | 0 refills | Status: DC

## 2016-07-27 MED ORDER — DIAZEPAM 5 MG PO TABS
5.0000 mg | ORAL_TABLET | Freq: Once | ORAL | Status: AC
  Administered 2016-07-27: 5 mg via ORAL
  Filled 2016-07-27: qty 1

## 2016-07-27 MED ORDER — HYDROCODONE-ACETAMINOPHEN 5-325 MG PO TABS
1.0000 | ORAL_TABLET | Freq: Once | ORAL | Status: AC
  Administered 2016-07-27: 1 via ORAL
  Filled 2016-07-27: qty 1

## 2016-07-27 MED ORDER — DICLOFENAC SODIUM 75 MG PO TBEC: 75.0000 mg | DELAYED_RELEASE_TABLET | Freq: Two times a day (BID) | ORAL | 0 refills | Status: DC

## 2016-07-27 MED ORDER — KETOROLAC TROMETHAMINE 60 MG/2ML IM SOLN
60.0000 mg | Freq: Once | INTRAMUSCULAR | Status: DC
Start: 2016-07-27 — End: 2016-07-27

## 2016-07-27 NOTE — Discharge Instructions (Signed)
Apply ice packs on/off.  Follow-up with your doctor for recheck.  Return here for any worsening symptoms
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
## 2017-02-01 ENCOUNTER — Encounter (HOSPITAL_COMMUNITY): Payer: Self-pay | Admitting: Emergency Medicine

## 2017-02-01 ENCOUNTER — Emergency Department (HOSPITAL_COMMUNITY)
Admission: EM | Admit: 2017-02-01 | Discharge: 2017-02-02 | Disposition: A | Discharge status: Home / Self Care | Payer: Self-pay | Attending: Emergency Medicine | Admitting: Emergency Medicine

## 2017-02-01 DIAGNOSIS — K648 Other hemorrhoids: Secondary | ICD-10-CM | POA: Insufficient documentation

## 2017-02-01 DIAGNOSIS — Z791 Long term (current) use of non-steroidal anti-inflammatories (NSAID): Secondary | ICD-10-CM | POA: Insufficient documentation

## 2017-02-01 DIAGNOSIS — K6289 Other specified diseases of anus and rectum: Secondary | ICD-10-CM | POA: Insufficient documentation

## 2017-02-01 NOTE — ED Triage Notes (Signed)
Pt states she is having severe rectal pain with occasional bleeding

## 2017-02-02 ENCOUNTER — Emergency Department (HOSPITAL_COMMUNITY): Payer: Self-pay

## 2017-02-02 LAB — COMPREHENSIVE METABOLIC PANEL
ALT: 37 U/L (ref 14–54)
AST: 24 U/L (ref 15–41)
Albumin: 4 g/dL (ref 3.5–5.0)
Alkaline Phosphatase: 75 U/L (ref 38–126)
Anion gap: 8 (ref 5–15)
BUN: 16 mg/dL (ref 6–20)
CO2: 28 mmol/L (ref 22–32)
Calcium: 9.5 mg/dL (ref 8.9–10.3)
Chloride: 106 mmol/L (ref 101–111)
Creatinine, Ser: 0.83 mg/dL (ref 0.44–1.00)
Glucose, Bld: 109 mg/dL — ABNORMAL HIGH (ref 65–99)
Potassium: 3.7 mmol/L (ref 3.5–5.1)
Sodium: 142 mmol/L (ref 135–145)
Total Bilirubin: 0.4 mg/dL (ref 0.3–1.2)
Total Protein: 6.9 g/dL (ref 6.5–8.1)

## 2017-02-02 LAB — CBC WITH DIFFERENTIAL/PLATELET
Basophils Absolute: 0 10*3/uL (ref 0.0–0.1)
Basophils Relative: 0 %
EOS ABS: 0.1 10*3/uL (ref 0.0–0.7)
Eosinophils Relative: 2 %
HCT: 38.4 % (ref 36.0–46.0)
HEMOGLOBIN: 13.2 g/dL (ref 12.0–15.0)
LYMPHS PCT: 35 %
Lymphs Abs: 3.1 10*3/uL (ref 0.7–4.0)
MCH: 31.9 pg (ref 26.0–34.0)
MCHC: 34.4 g/dL (ref 30.0–36.0)
MCV: 92.8 fL (ref 78.0–100.0)
Monocytes Absolute: 0.7 10*3/uL (ref 0.1–1.0)
Monocytes Relative: 8 %
NEUTROS PCT: 55 %
Neutro Abs: 4.9 10*3/uL (ref 1.7–7.7)
Platelets: 243 10*3/uL (ref 150–400)
RBC: 4.14 MIL/uL (ref 3.87–5.11)
RDW: 12.5 % (ref 11.5–15.5)
WBC: 8.8 10*3/uL (ref 4.0–10.5)

## 2017-02-02 LAB — I-STAT BETA HCG BLOOD, ED (MC, WL, AP ONLY)

## 2017-02-02 LAB — POC OCCULT BLOOD, ED: FECAL OCCULT BLD: NEGATIVE

## 2017-02-02 MED ORDER — ACETAMINOPHEN 500 MG PO TABS
1000.0000 mg | ORAL_TABLET | Freq: Once | ORAL | Status: AC
  Administered 2017-02-02: 1000 mg via ORAL
  Filled 2017-02-02: qty 2

## 2017-02-02 MED ORDER — TRAMADOL HCL 50 MG PO TABS: 100.0000 mg | ORAL_TABLET | Freq: Four times a day (QID) | ORAL | 0 refills | Status: DC | PRN

## 2017-02-02 MED ORDER — HYDROCORTISONE ACETATE 25 MG RE SUPP: 25.0000 mg | Freq: Two times a day (BID) | RECTAL | 0 refills | Status: DC

## 2017-02-02 MED ORDER — HYDROCORTISONE ACETATE 25 MG RE SUPP
25.0000 mg | Freq: Once | RECTAL | Status: AC
  Administered 2017-02-02: 25 mg via RECTAL
  Filled 2017-02-02: qty 1

## 2017-02-02 MED ORDER — FENTANYL CITRATE (PF) 100 MCG/2ML IJ SOLN
50.0000 ug | Freq: Once | INTRAMUSCULAR | Status: AC
  Administered 2017-02-02: 50 ug via INTRAVENOUS
  Filled 2017-02-02: qty 2

## 2017-02-02 MED ORDER — ONDANSETRON HCL 4 MG/2ML IJ SOLN
4.0000 mg | Freq: Once | INTRAMUSCULAR | Status: AC
  Administered 2017-02-02: 4 mg via INTRAVENOUS
  Filled 2017-02-02: qty 2

## 2017-02-02 MED ORDER — SODIUM CHLORIDE 0.9 % IV BOLUS (SEPSIS)
1000.0000 mL | Freq: Once | INTRAVENOUS | Status: AC
  Administered 2017-02-02: 1000 mL via INTRAVENOUS

## 2017-02-02 MED ORDER — TRAMADOL HCL 50 MG PO TABS
100.0000 mg | ORAL_TABLET | Freq: Once | ORAL | Status: AC
  Administered 2017-02-02: 100 mg via ORAL
  Filled 2017-02-02: qty 2

## 2017-02-02 MED ORDER — IOPAMIDOL (ISOVUE-300) INJECTION 61%
100.0000 mL | Freq: Once | INTRAVENOUS | Status: AC | PRN
  Administered 2017-02-02: 100 mL via INTRAVENOUS

## 2017-02-02 NOTE — ED Provider Notes (Signed)
[facility]Y DEPT Provider Note   CSN: [id number] Arrival date & time: 02/01/17  2223  Time seen 12:00 AM   History   Chief Complaint Chief Complaint  Patient presents with  . Rectal Pain    HPI Katelyn Dixon is a 36 y.o. female.  HPI  patient states she's had problems with hemorrhoids in the past. She states for the past month she's been having alteration between diarrhea and constipation back and forth. She reports she feels like she has rectal swelling and using Tucks and witch Hazel without improvement. She states for the last couple days she's had severe pain rectally feels like she has a open nerve. She states about 2 weeks ago she had bright red blood rectally with a large gush of blood with blood in the toilet water. He has not had bleeding recently. She denies any nausea, vomiting, or fever. She states she has some mild discomfort in her left lower quadrant.  PCP Dr Edwyna Shell in [place], [place]  History reviewed. No pertinent past medical history.  There are no active problems to display for this patient.   Past Surgical History:  Procedure Laterality Date  . LITHOTRIPSY      OB History    No data available       Home Medications    Prior to Admission medications   Medication Sig Start Date End Date Taking? Authorizing Provider  diazepam (VALIUM) 5 MG tablet Take 1 tablet (5 mg total) by mouth 2 (two) times daily. As needed for muscle spasms 07/27/16   Triplett, Tammy, PA-C  diclofenac (VOLTAREN) 75 MG EC tablet Take 1 tablet (75 mg total) by mouth 2 (two) times daily. Take with food 07/27/16   Triplett, Tammy, PA-C  hydrocortisone (ANUSOL-HC) 25 MG suppository Place 1 suppository (25 mg total) rectally 2 (two) times daily. 02/02/17   Devoria Albe, MD  traMADol (ULTRAM) 50 MG tablet Take 2 tablets (100 mg total) by mouth every 6 (six) hours as needed. 02/02/17   Devoria Albe, MD    Family History History reviewed. No pertinent family history.  Social  History Social History  Substance Use Topics  . Smoking status: Never Smoker  . Smokeless tobacco: Never Used  . Alcohol use Yes     Comment: occ.  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   Review of Systems Review of Systems  All other systems reviewed and are negative.    Physical Exam Updated Vital Signs BP (!) 136/91   Pulse (!) 123   Temp 97.9 F (36.6 C) (Oral)   Resp 20   Ht 5\' 5"  (1.651 m)   Wt 81.6 kg (180 lb)   LMP 01/30/2017   SpO2 100%   BMI 29.95 kg/m   Vital signs normal except for tachycardia   Physical Exam  Constitutional: She is oriented to person, place, and time. She appears well-developed and well-nourished.  Non-toxic appearance. She does not appear ill. No distress.  HENT:  Head: Normocephalic and atraumatic.  Right Ear: External ear normal.  Left Ear: External ear normal.  Nose: Nose normal. No mucosal edema or rhinorrhea.  Mouth/Throat: Oropharynx is clear and moist and mucous membranes are normal. No dental abscesses or uvula swelling.  Eyes: Conjunctivae and EOM are normal. Pupils are equal, round, and reactive to light.  Neck: Normal range of motion and full passive range of motion without pain. Neck supple.  Cardiovascular: Normal rate, regular rhythm and  normal heart sounds.  Exam reveals no gallop and no friction rub.   No murmur heard. Pulmonary/Chest: Effort normal and breath sounds normal. No respiratory distress. She has no wheezes. She has no rhonchi. She has no rales. She exhibits no tenderness and no crepitus.  Abdominal: Soft. Normal appearance and bowel sounds are normal. She exhibits no distension. There is no tenderness. There is no rebound and no guarding.  Genitourinary:  Genitourinary Comments: Patient has some small old hemorrhoid tags without any acute swelling, redness or apparent inflammation of the hemorrhoids. There is no blood seen. On rectal exam she is very tender when  I entered the rectal vault. There appear to be some swollen internal hemorrhoids. The vault was empty. Her Hemoccult was without gross blood but was Hemoccult positive.  Musculoskeletal: Normal range of motion. She exhibits no edema or tenderness.  Moves all extremities well.   Neurological: She is alert and oriented to person, place, and time. She has normal strength. No cranial nerve deficit.  Skin: Skin is warm, dry and intact. No rash noted. No erythema. No pallor.  Psychiatric: She has a normal mood and affect. Her speech is normal and behavior is normal. Her mood appears not anxious.  Nursing note and vitals reviewed.    ED Treatments / Results  Labs (all labs ordered are listed, but only abnormal results are displayed) Results for orders placed or performed during the hospital encounter of 02/01/17  Comprehensive metabolic panel  Result Value Ref Range   Sodium 142 135 - 145 mmol/L   Potassium 3.7 3.5 - 5.1 mmol/L   Chloride 106 101 - 111 mmol/L   CO2 28 22 - 32 mmol/L   Glucose, Bld 109 (H) 65 - 99 mg/dL   BUN 16 6 - 20 mg/dL   Creatinine, Ser 1.61 0.44 - 1.00 mg/dL   Calcium 9.5 8.9 - 09.6 mg/dL   Total Protein 6.9 6.5 - 8.1 g/dL   Albumin 4.0 3.5 - 5.0 g/dL   AST 24 15 - 41 U/L   ALT 37 14 - 54 U/L   Alkaline Phosphatase 75 38 - 126 U/L   Total Bilirubin 0.4 0.3 - 1.2 mg/dL   GFR calc non Af Amer >60 >60 mL/min   GFR calc Af Amer >60 >60 mL/min   Anion gap 8 5 - 15  CBC with Differential  Result Value Ref Range   WBC 8.8 4.0 - 10.5 K/uL   RBC 4.14 3.87 - 5.11 MIL/uL   Hemoglobin 13.2 12.0 - 15.0 g/dL   HCT 04.5 40.9 - 81.1 %   MCV 92.8 78.0 - 100.0 fL   MCH 31.9 26.0 - 34.0 pg   MCHC 34.4 30.0 - 36.0 g/dL   RDW 91.4 78.2 - 95.6 %   Platelets 243 150 - 400 K/uL   Neutrophils Relative % 55 %   Neutro Abs 4.9 1.7 - 7.7 K/uL   Lymphocytes Relative 35 %   Lymphs Abs 3.1 0.7 - 4.0 K/uL   Monocytes Relative 8 %   Monocytes Absolute 0.7 0.1 - 1.0 K/uL   Eosinophils  Relative 2 %   Eosinophils Absolute 0.1 0.0 - 0.7 K/uL   Basophils Relative 0 %   Basophils Absolute 0.0 0.0 - 0.1 K/uL  POC occult blood, ED Provider will collect  Result Value Ref Range   Fecal Occult Bld NEGATIVE NEGATIVE  I-Stat beta hCG blood, ED  Result Value Ref Range   I-stat hCG, quantitative <5.0 <5 mIU/mL  Comment 3           Laboratory interpretation all normal    EKG  EKG Interpretation None       Radiology Ct Abdomen Pelvis W Contrast  Result Date: 02/02/2017 CLINICAL DATA:  36 y/o  F; rectal pain with bleeding for 2 weeks. EXAM: CT ABDOMEN AND PELVIS WITH CONTRAST TECHNIQUE: Multidetector CT imaging of the abdomen and pelvis was performed using the standard protocol following bolus administration of intravenous contrast. CONTRAST:  100mL ISOVUE-300 IOPAMIDOL (ISOVUE-300) INJECTION 61% COMPARISON:  None. FINDINGS: Lower chest: No acute abnormality. Hepatobiliary: No focal liver abnormality is seen. No gallstones, gallbladder wall thickening, or biliary dilatation. Pancreas: Unremarkable. No pancreatic ductal dilatation or surrounding inflammatory changes. Spleen: Normal in size without focal abnormality. Adrenals/Urinary Tract: Adrenal glands are unremarkable. Kidneys are normal, without renal calculi, focal lesion, or hydronephrosis. Bladder is unremarkable. Stomach/Bowel: Stomach is within normal limits. Appendix appears normal. No evidence of bowel wall thickening, distention, or inflammatory changes. Vascular/Lymphatic: No significant vascular findings are present. No enlarged abdominal or pelvic lymph nodes. Reproductive: Uterus and bilateral adnexa are unremarkable. Vaginal cup in situ. Other: No abdominal wall hernia or abnormality. No abdominopelvic ascites. Musculoskeletal: No acute or significant osseous findings. IMPRESSION: No acute process identified as explanation for pain. Unremarkable CT of abdomen and pelvis. Electronically Signed   By: Mitzi HansenLance   Furusawa-Stratton M.D.   On: 02/02/2017 03:44    Procedures Procedures (including critical care time)  Medications Ordered in ED Medications  hydrocortisone (ANUSOL-HC) suppository 25 mg (25 mg Rectal Given 02/02/17 0033)  traMADol (ULTRAM) tablet 100 mg (100 mg Oral Given 02/02/17 0033)  acetaminophen (TYLENOL) tablet 1,000 mg (1,000 mg Oral Given 02/02/17 0033)  sodium chloride 0.9 % bolus 1,000 mL (1,000 mLs Intravenous New Bag/Given 02/02/17 0156)  fentaNYL (SUBLIMAZE) injection 50 mcg (50 mcg Intravenous Given 02/02/17 0156)  ondansetron (ZOFRAN) injection 4 mg (4 mg Intravenous Given 02/02/17 0156)  iopamidol (ISOVUE-300) 61 % injection 100 mL (100 mLs Intravenous Contrast Given 02/02/17 0318)     Initial Impression / Assessment and Plan / ED Course  I have reviewed the triage vital signs and the nursing notes.  Pertinent labs & imaging results that were available during my care of the patient were reviewed by me and considered in my medical decision making (see chart for details).  I suspect patient has a internal hemorrhoid. She was started on Anusol HC suppositories and she was given tramadol orally for pain.  Recheck at 1:15 AM patient states she's had no relief with medications. At this point consideration was made to do a CT to make sure she doesn't have a perirectal abscess that is not apparent from external exam. She is agreeable.  3:50 AM patient CT has resulted and shows no acute findings. Patient was discharged home with the Anusol suppositories, tramadol to take with over-the-counter ibuprofen and Motrin. She was referred to Dr. Lovell SheehanJenkins, general surgery and Dr. Darrick Pennafields gastroenterology. She should return to the emergency room she gets a fever, heavy bleeding, or it seems to be getting worse.  Review of the [place] and [place] database shows no entries for the past 6 months and 1 year respectively.  Final Clinical Impressions(s) / ED Diagnoses   Final diagnoses:    Rectal pain  Internal hemorrhoid    New Prescriptions New Prescriptions   HYDROCORTISONE (ANUSOL-HC) 25 MG SUPPOSITORY    Place 1 suppository (25 mg total) rectally 2 (two) times daily.   TRAMADOL (ULTRAM) 50 MG  TABLET    Take 2 tablets (100 mg total) by mouth every 6 (six) hours as needed.  OTC ibuprofen and acetaminophen  Plan discharge  Devoria Albe, MD, Concha Pyo, MD 02/02/17 [phone]

## 2017-02-02 NOTE — Discharge Instructions (Signed)
Take the tramadol with acetaminophen 1000 mg + ibuprofen 600 mg  every 6 hrs as needed for pain. Use the rectal suppositories to get the hemorrhoids calmed down.  If you continue to have pain, you should be rechecked by either Dr Lovell SheehanJenkins, a general surgeon, or Dr Darrick PennaFields, a gastroenterologist.  Return to the ED if you get a fever, pass a lot of blood or seem worse.

## 2017-03-07 ENCOUNTER — Emergency Department (HOSPITAL_COMMUNITY)
Admission: EM | Admit: 2017-03-07 | Discharge: 2017-03-07 | Disposition: A | Discharge status: Home / Self Care | Payer: Self-pay | Attending: Emergency Medicine | Admitting: Emergency Medicine

## 2017-03-07 ENCOUNTER — Encounter (HOSPITAL_COMMUNITY): Payer: Self-pay | Admitting: Emergency Medicine

## 2017-03-07 DIAGNOSIS — K5900 Constipation, unspecified: Secondary | ICD-10-CM | POA: Insufficient documentation

## 2017-03-07 DIAGNOSIS — R197 Diarrhea, unspecified: Secondary | ICD-10-CM | POA: Insufficient documentation

## 2017-03-07 DIAGNOSIS — Z79899 Other long term (current) drug therapy: Secondary | ICD-10-CM | POA: Insufficient documentation

## 2017-03-07 DIAGNOSIS — K6289 Other specified diseases of anus and rectum: Secondary | ICD-10-CM | POA: Insufficient documentation

## 2017-03-07 MED ORDER — TRAMADOL HCL 50 MG PO TABS
50.0000 mg | ORAL_TABLET | Freq: Once | ORAL | Status: AC
  Administered 2017-03-07: 50 mg via ORAL
  Filled 2017-03-07: qty 1

## 2017-03-07 MED ORDER — TRAMADOL HCL 50 MG PO TABS: 50.0000 mg | ORAL_TABLET | Freq: Four times a day (QID) | ORAL | 0 refills | Status: DC
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
## 2017-03-07 NOTE — ED Triage Notes (Signed)
Pt has had severe anal pain x 6 weeks.  Has appointment for colonoscopy 08/08.  Ran out of pain medication and is in severe pain now.

## 2017-03-07 NOTE — Discharge Instructions (Signed)
Sit in warm bathtub 4 times daily for 30 minutes of time. Take the medication prescribed for bad pain or Tylenol as directed for mild pain. Keep your scheduled appointment for colonoscopy on 03/13/2017

## 2017-05-03 ENCOUNTER — Ambulatory Visit: Payer: Self-pay | Admitting: Family Medicine

## 2018-04-23 ENCOUNTER — Other Ambulatory Visit: Payer: Self-pay

## 2018-04-23 ENCOUNTER — Emergency Department (HOSPITAL_COMMUNITY): Payer: Self-pay

## 2018-04-23 ENCOUNTER — Emergency Department (HOSPITAL_COMMUNITY)
Admission: EM | Admit: 2018-04-23 | Discharge: 2018-04-23 | Disposition: A | Discharge status: Home / Self Care | Payer: Self-pay | Attending: Emergency Medicine | Admitting: Emergency Medicine

## 2018-04-23 ENCOUNTER — Encounter (HOSPITAL_COMMUNITY): Payer: Self-pay | Admitting: Emergency Medicine

## 2018-04-23 DIAGNOSIS — S8002XA Contusion of left knee, initial encounter: Secondary | ICD-10-CM | POA: Insufficient documentation

## 2018-04-23 DIAGNOSIS — Y939 Activity, unspecified: Secondary | ICD-10-CM | POA: Insufficient documentation

## 2018-04-23 DIAGNOSIS — M25562 Pain in left knee: Secondary | ICD-10-CM

## 2018-04-23 DIAGNOSIS — Y929 Unspecified place or not applicable: Secondary | ICD-10-CM | POA: Insufficient documentation

## 2018-04-23 DIAGNOSIS — Y999 Unspecified external cause status: Secondary | ICD-10-CM | POA: Insufficient documentation
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
## 2018-04-23 NOTE — ED Notes (Signed)
Pt returned from xray

## 2018-04-23 NOTE — Discharge Instructions (Signed)
Your x-rays did not show any broken bones in your knee.  Please take Ibuprofen (Advil, motrin) and Tylenol (acetaminophen) to relieve your pain.  You may take up to 600 MG (3 pills) of normal strength ibuprofen every 8 hours as needed.  In between doses of ibuprofen you make take tylenol, up to 1,000 mg (two extra strength pills).  Do not take more than 3,000 mg tylenol in a 24 hour period.  Please check all medication labels as many medications such as pain and cold medications may contain tylenol.  Do not drink alcohol while taking these medications.  Do not take other NSAID'S while taking ibuprofen (such as aleve or naproxen).  Please take ibuprofen with food to decrease stomach upset.
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
